# Patient Record
Sex: Male | Born: 1990
Health system: Southern US, Community
[De-identification: ages and names within clinical notes are randomized; demographics above are authoritative.]

## PROBLEM LIST (undated history)

## (undated) DIAGNOSIS — J302 Other seasonal allergic rhinitis: Secondary | ICD-10-CM

## (undated) DIAGNOSIS — H33321 Round hole, right eye: Secondary | ICD-10-CM

## (undated) HISTORY — DX: Other seasonal allergic rhinitis: J30.2

## (undated) HISTORY — DX: Round hole, right eye: H33.321

---
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
## 2009-06-15 ENCOUNTER — Emergency Department: Payer: Self-pay | Admitting: Unknown Physician Specialty

## 2009-12-06 ENCOUNTER — Emergency Department: Payer: Self-pay | Admitting: Emergency Medicine

## 2018-09-07 ENCOUNTER — Emergency Department
Admission: EM | Admit: 2018-09-07 | Discharge: 2018-09-07 | Disposition: A | Discharge status: Home / Self Care | Payer: Self-pay | Attending: Emergency Medicine | Admitting: Emergency Medicine

## 2018-09-07 ENCOUNTER — Emergency Department: Payer: Self-pay

## 2018-09-07 ENCOUNTER — Other Ambulatory Visit: Payer: Self-pay

## 2018-09-07 ENCOUNTER — Encounter: Payer: Self-pay | Admitting: *Deleted

## 2018-09-07 DIAGNOSIS — R002 Palpitations: Secondary | ICD-10-CM | POA: Insufficient documentation

## 2018-09-07 LAB — BASIC METABOLIC PANEL
ANION GAP: 7 (ref 5–15)
BUN: 15 mg/dL (ref 6–20)
CHLORIDE: 103 mmol/L (ref 98–111)
CO2: 27 mmol/L (ref 22–32)
Calcium: 9 mg/dL (ref 8.9–10.3)
Creatinine, Ser: 1.15 mg/dL (ref 0.61–1.24)
GFR calc non Af Amer: >60 mL/min (ref >60)
GLUCOSE: 99 mg/dL (ref 70–99)
Potassium: 3.8 mmol/L (ref 3.5–5.1)
Sodium: 137 mmol/L (ref 135–145)

## 2018-09-07 LAB — CBC
HCT: 43.8 % (ref 40.0–52.0)
HEMOGLOBIN: 14.5 g/dL (ref 13.0–18.0)
MCH: 25.9 pg — AB (ref 26.0–34.0)
MCHC: 33.1 g/dL (ref 32.0–36.0)
MCV: 78.1 fL — AB (ref 80.0–100.0)
Platelets: 309 10*3/uL (ref 150–440)
RBC: 5.61 MIL/uL (ref 4.40–5.90)
RDW: 13.8 % (ref 11.5–14.5)
WBC: 7.5 10*3/uL (ref 3.8–10.6)

## 2018-09-07 LAB — TROPONIN I: Troponin I: <0.03 ng/mL (ref <0.03)

## 2018-09-07 MED ORDER — FAMOTIDINE 40 MG PO TABS: 40.0000 mg | ORAL_TABLET | Freq: Every evening | ORAL | 1 refills | Status: DC

## 2018-09-07 NOTE — ED Notes (Signed)
Patient reports intermittent chest pain with occasional radiation to left arm x1 month. Patient also reports intermittent dizziness, SOB, and lightheadedness with chest pain. Patient denies any cardiac history.

## 2018-09-07 NOTE — ED Triage Notes (Signed)
Pt ambulatory to triage.  Pt reports when going to bed at night his heart is racing.  Pt also reports palpitations with chest pain.  No sob.  Pt alert.

## 2018-09-07 NOTE — Discharge Instructions (Addendum)
In follow up please discuss a Holter monitor to keep track of your heart rhythm. Please seek medical attention for any high fevers, chest pain, shortness of breath, change in behavior, persistent vomiting, bloody stool or any other new or concerning symptoms.

## 2018-09-07 NOTE — ED Provider Notes (Signed)
[facility] Emergency Department Provider Note  ____________________________________________   I have reviewed the triage vital signs and the nursing notes.   HISTORY  Chief Complaint Palpitations  History limited by: Not Limited   HPI Samuel Hendricks is a 27 y.o. male who presents to the emergency department today with concerns for palpitations and feeling of abnormal rhythm to his heart.  Patient states that the symptoms have been going on and off for about 1 month.  He has noticed that they are somewhat worse at night.  Will make him jump up out of bed.  He does however also occur during the day.  He has noticed no pattern with it during the day.  Today when he had 1 of these episodes he also started feeling lightheaded.  Patient denies any new medications recently.  No recent trauma to his chest.  Does have one uncle who died from heart related illness in his 50s.   No past medical history on file.  There are no active problems to display for this patient.  Prior to Admission medications   Not on File    Allergies Patient has no known allergies.  No family history on file.  Social History Social History   Tobacco Use  . Smoking status: Never Smoker  . Smokeless tobacco: Never Used  Substance Use Topics  . Alcohol use: Never    Frequency: Never  . Drug use: Never    Review of Systems Constitutional: No fever/chills Eyes: No visual changes. ENT: No sore throat. Cardiovascular: Denies chest pain. Respiratory: Denies shortness of breath. Gastrointestinal: No abdominal pain.  No nausea, no vomiting.  No diarrhea.   Genitourinary: Negative for dysuria. Musculoskeletal: Negative for back pain. Skin: Negative for rash. Neurological: Negative for headaches, focal weakness or numbness.  ____________________________________________   PHYSICAL EXAM:  VITAL SIGNS: ED Triage Vitals  Enc Vitals Group     BP 09/07/18 1741 127/87     Pulse  Rate 09/07/18 1741 76     Resp 09/07/18 1741 20     Temp 09/07/18 1741 98.8 F (37.1 C)     Temp Source 09/07/18 1741 Oral     SpO2 09/07/18 1741 99 %     Weight 09/07/18 1742 285 lb (129.3 kg)     Height 09/07/18 1742 6\' 2"  (1.88 m)     Head Circumference --      Peak Flow --      Pain Score 09/07/18 1742 0   Constitutional: Alert and oriented.  Eyes: Conjunctivae are normal.  ENT      Head: Normocephalic and atraumatic.      Nose: No congestion/rhinnorhea.      Mouth/Throat: Mucous membranes are moist.      Neck: No stridor. Hematological/Lymphatic/Immunilogical: No cervical lymphadenopathy. Cardiovascular: Normal rate, regular rhythm.  No murmurs, rubs, or gallops.  Respiratory: Normal respiratory effort without tachypnea nor retractions. Breath sounds are clear and equal bilaterally. No wheezes/rales/rhonchi. Gastrointestinal: Soft and non tender. No rebound. No guarding.  Genitourinary: Deferred Musculoskeletal: Normal range of motion in all extremities. No lower extremity edema. Neurologic:  Normal speech and language. No gross focal neurologic deficits are appreciated.  Skin:  Skin is warm, dry and intact. No rash noted. Psychiatric: Mood and affect are normal. Speech and behavior are normal. Patient exhibits appropriate insight and judgment.  ____________________________________________    LABS (pertinent positives/negatives)  Trop <0.03 BMP wnl CBC wbc 7.5, hgb 14.5, plt 309  ____________________________________________   EKG  Apolonio Schneiders, attending physician, personally viewed and interpreted this EKG  EKG Time: 1736 Rate: 74 Rhythm: normal sinus rhythm Axis: normal Intervals: qtc 412 QRS: narrow ST changes: no st elevation Impression: normal ekg   ____________________________________________    RADIOLOGY  CXR No acute  disease  ____________________________________________   PROCEDURES  Procedures  ____________________________________________   INITIAL IMPRESSION / ASSESSMENT AND PLAN / ED COURSE  Pertinent labs & imaging results that were available during my care of the patient were reviewed by me and considered in my medical decision making (see chart for details).   Patient presented to the emergency department today because of concerns for palpitations.  Patient's work-up here without concerning findings.  EKG without dysrhythmia, no delta waves no Brugada.  Chest x-ray without an enlarged heart.  Blood work without concerning electrolyte abnormalities or evidence of heart damage.  Discussed with patient results.  Did discuss importance of follow-up and possible Holter monitor to evaluate.   ____________________________________________   FINAL CLINICAL IMPRESSION(S) / ED DIAGNOSES  Final diagnoses:  Palpitations     Note: This dictation was prepared with Dragon dictation. Any transcriptional errors that result from this process are unintentional     Nance Pear, MD 09/07/18 1905

## 2018-09-07 NOTE — ED Notes (Signed)
Pt discharged home after verbalizing understanding of discharge instructions; nad noted. 

## 2018-09-08 ENCOUNTER — Telehealth: Payer: Self-pay

## 2018-09-08 NOTE — Telephone Encounter (Signed)
Lmov for patient to call and schedule ED fu from 09/07/18 for CP   Will try again at later time

## 2018-09-10 NOTE — Telephone Encounter (Signed)
Patient scheduled for 11/12/18

## 2018-09-25 ENCOUNTER — Ambulatory Visit: Payer: Self-pay | Admitting: Family Medicine

## 2018-09-25 ENCOUNTER — Encounter: Payer: Self-pay | Admitting: Family Medicine

## 2018-09-25 VITALS — BP 132/86 | HR 68 | Temp 97.8°F | Resp 18 | Ht 73.0 in | Wt 278.3 lb

## 2018-09-25 DIAGNOSIS — E049 Nontoxic goiter, unspecified: Secondary | ICD-10-CM

## 2018-09-25 DIAGNOSIS — Z113 Encounter for screening for infections with a predominantly sexual mode of transmission: Secondary | ICD-10-CM

## 2018-09-25 DIAGNOSIS — Z13220 Encounter for screening for lipoid disorders: Secondary | ICD-10-CM

## 2018-09-25 DIAGNOSIS — E669 Obesity, unspecified: Secondary | ICD-10-CM

## 2018-09-25 DIAGNOSIS — R002 Palpitations: Secondary | ICD-10-CM

## 2018-09-25 DIAGNOSIS — R5383 Other fatigue: Secondary | ICD-10-CM

## 2018-09-25 DIAGNOSIS — Z131 Encounter for screening for diabetes mellitus: Secondary | ICD-10-CM

## 2018-09-25 DIAGNOSIS — Z832 Family history of diseases of the blood and blood-forming organs and certain disorders involving the immune mechanism: Secondary | ICD-10-CM

## 2018-09-25 DIAGNOSIS — R030 Elevated blood-pressure reading, without diagnosis of hypertension: Secondary | ICD-10-CM

## 2018-09-25 DIAGNOSIS — R718 Other abnormality of red blood cells: Secondary | ICD-10-CM

## 2018-09-25 DIAGNOSIS — Z23 Encounter for immunization: Secondary | ICD-10-CM

## 2018-09-25 MED ORDER — ATENOLOL 25 MG PO TABS: 12.5000 mg | ORAL_TABLET | Freq: Every evening | ORAL | 0 refills | Status: DC

## 2018-09-25 NOTE — Addendum Note (Signed)
Addended by: Inda Coke on: 09/25/2018 09:53 AM   Modules accepted: Orders

## 2018-09-25 NOTE — Progress Notes (Signed)
Name: Samuel Hendricks   MRN: [id number]    DOB: [birth year]   Date:09/25/2018       Progress Note  Subjective  Chief Complaint  Chief Complaint  Patient presents with  . Establish Care    HPI  Elevated BP: he is worried because there is family history of HTN and bp at home has been in the 140's/90's after work, but in am's usually in the 120's/80's. He denies chest pain , he has intermittent palpitation. He went to [facility] a couple of weeks ago with symptoms of palpitation and dizziness. Hi basic metabolic panel that was normal. CBC was normal except for low MCV, and negative troponin. His weight was 285 lbs. He states he has lost weight over the past few months. He was in the 298 lbs in the beginning of 2019. He states he has been trying to lose weight by increasing physical activity. He states he has noticed fatigue.  His sister and father have thyroid problems. He has been more anxious over the past few months. He states he went through 22 interviews in the past 3 months, he states that is when symptoms really started. Explained it may be all related to anxiety   Past Surgical History:  Procedure Laterality Date  . APPENDECTOMY  12/02/1997    Family History  Problem Relation Age of Onset  . Anemia Mother   . Diabetes Father   . Thyroid disease Sister   . Hypertension Maternal Grandmother   . Heart disease Paternal Grandmother   . Diabetes Paternal Grandfather     Social History   Socioeconomic History  . Marital status: Single    Spouse name: Not on file  . Number of children: 0  . Years of education: Not on file  . Highest education level: Bachelor's degree (e.g., BA, AB, BS)  Occupational History  . Occupation: Admission Counselor    Comment: [place]  . Financial resource strain: Not hard at all  . Food insecurity:    Worry: Never true    Inability: Never true  . Transportation needs:    Medical: No    Non-medical: No  Tobacco Use  . Smoking  status: Never Smoker  . Smokeless tobacco: Never Used  Substance and Sexual Activity  . Alcohol use: Not Currently    Frequency: Never    Comment: rarely  . Drug use: Not Currently    Types: Marijuana    Comment: in the past  . Sexual activity: Yes    Partners: Female    Birth control/protection: Condom  Lifestyle  . Physical activity:    Days per week: 1 day    Minutes per session: 40 min  . Stress: Only a little  Relationships  . Social connections:    Talks on phone: More than three times a week    Gets together: Three times a week    Attends religious service: More than 4 times per year    Active member of club or organization: Yes    Attends meetings of clubs or organizations: More than 4 times per year    Relationship status: Never married  . Intimate partner violence:    Fear of current or ex partner: No    Emotionally abused: No    Physically abused: No    Forced sexual activity: No  Other Topics Concern  . Not on file  Social History Narrative   Graduated from [employer] in [employer]  Currently working at [employer]      Current Outpatient Medications:  .  atenolol (TENORMIN) 25 MG tablet, Take 0.5-1 tablets (12.5-25 mg total) by mouth every evening., Disp: 30 tablet, Rfl: 0 .  famotidine (PEPCID) 40 MG tablet, Take 1 tablet (40 mg total) by mouth every evening. (Patient not taking: Reported on 09/25/2018), Disp: 30 tablet, Rfl: 1  No Known Allergies  I personally reviewed active problem list, medication list, allergies, family history, social history, health maintenance with the patient/caregiver today.   ROS  Constitutional: Negative for fever, positive for weight change.  Respiratory: Negative for cough , positive for intermittent shortness of breath.   Cardiovascular: Negative for chest pain , but he has intermittent  palpitations.  Gastrointestinal: Negative for abdominal pain, no bowel changes.  Musculoskeletal: Negative for gait problem  or joint swelling.  Skin: Negative for rash.  Neurological: Negative for dizziness or headache.  No other specific complaints in a complete review of systems (except as listed in HPI above).  Objective  Vitals:   09/25/18 0828  BP: 132/86  Pulse: 68  Resp: 18  Temp: 97.8 F (36.6 C)  TempSrc: Oral  SpO2: 99%  Weight: 278 lb 4.8 oz (126.2 kg)  Height: _0  (1.854 m)    Body mass index is 36.72 kg/m.  Physical Exam  Constitutional: Patient appears well-developed and well-nourished. Obese  No distress.  HEENT: head atraumatic, normocephalic, pupils equal and reactive to light, neck supple, throat within normal limits Cardiovascular: Normal rate, regular rhythm and normal heart sounds.  No murmur heard. No BLE edema. Pulmonary/Chest: Effort normal and breath sounds normal. No respiratory distress. Abdominal: Soft.  There is no tenderness. Psychiatric: Patient has a normal mood and affect. behavior is normal. Judgment and thought content normal.  Recent Results (from the past 2160 hour(s))  Basic metabolic panel     Status: None   Collection Time: 09/07/18  5:44 PM  Result Value Ref Range   Sodium 137 135 - 145 mmol/L   Potassium 3.8 3.5 - 5.1 mmol/L   Chloride 103 98 - 111 mmol/L   CO2 27 22 - 32 mmol/L   Glucose, Bld 99 70 - 99 mg/dL   BUN 15 6 - 20 mg/dL   Creatinine, Ser 1.15 0.61 - 1.24 mg/dL   Calcium 9.0 8.9 - 10.3 mg/dL   GFR calc non Af Amer >60 >60 mL/min   GFR calc Af Amer >60 >60 mL/min    Comment: (NOTE) The eGFR has been calculated using the CKD EPI equation. This calculation has not been validated in all clinical situations. eGFR's persistently <60 mL/min signify possible Chronic Kidney Disease.    Anion gap 7 5 - 15    Comment: Performed at [facility], [address] [place], [place] [address]  CBC     Status: Abnormal   Collection Time: 09/07/18  5:44 PM  Result Value Ref Range   WBC 7.5 3.8 - 10.6 K/uL   RBC 5.61 4.40 - 5.90 MIL/uL    Hemoglobin 14.5 13.0 - 18.0 g/dL   HCT 43.8 40.0 - 52.0 %   MCV 78.1 (L) 80.0 - 100.0 fL   MCH 25.9 (L) 26.0 - 34.0 pg   MCHC 33.1 32.0 - 36.0 g/dL   RDW 13.8 11.5 - 14.5 %   Platelets 309 150 - 440 K/uL    Comment: Performed at [facility], [address], [place], [place] [address]  Troponin I     Status:  None   Collection Time: 09/07/18  5:44 PM  Result Value Ref Range   Troponin I <0.03 <0.03 ng/mL    Comment: Performed at [facility], [address] [place], [place] [address]     PHQ2/9: Depression screen [facility] 2/9 09/25/2018  Decreased Interest 1  Down, Depressed, Hopeless 0  PHQ - 2 Score 1  Altered sleeping 1  Tired, decreased energy 2  Change in appetite 1  Feeling bad or failure about yourself  0  Trouble concentrating 0  Moving slowly or fidgety/restless 0  Suicidal thoughts 0  PHQ-9 Score 5  Difficult doing work/chores Somewhat difficult     Fall Risk: Fall Risk  09/25/2018  Falls in the past year? No     Functional Status Survey: Is the patient deaf or have difficulty hearing?: No Does the patient have difficulty seeing, even when wearing glasses/contacts?: Yes(glasses) Does the patient have difficulty concentrating, remembering, or making decisions?: No Does the patient have difficulty walking or climbing stairs?: No Does the patient have difficulty dressing or bathing?: No Does the patient have difficulty doing errands alone such as visiting a doctor's office or shopping?: No    Assessment & Plan  1. Other fatigue  - Hepatic function panel - B12 and Folate Panel  2. Need for immunization against influenza  - Flu Vaccine QUAD 6+ mos PF IM (Fluarix Quad PF)  3. Routine screening for STI (sexually transmitted infection)  - RPR - HIV antibody (with reflex) - Hepatitis, Acute - GC Probe amplification, urine  4. Elevated blood pressure reading  He will continue monitoring at home  5. Palpitation  - Thyroid Panel  With TSH - atenolol (TENORMIN) 25 MG tablet; Take 0.5-1 tablets (12.5-25 mg total) by mouth every evening.  Dispense: 30 tablet; Refill: 0  6. Obesity (BMI 35.0-39.9 without comorbidity)  Discussed with the patient the risk posed by an increased BMI. Discussed importance of portion control, calorie counting and at least 150 minutes of physical activity weekly. Avoid sweet beverages and drink more water. Eat at least 6 servings of fruit and vegetables daily   7. Family history of sickle cell trait  - Sickle Cell Scr  8. Abnormal red blood cells  - Iron, TIBC and Ferritin Panel; Future  9. Diabetes mellitus screening  - HgB A1c  10. Lipid screening  - Lipid panel  11. Enlarged thyroid  - US THYROID; Future

## 2018-09-25 NOTE — Patient Instructions (Signed)
DASH Eating Plan DASH stands for "Dietary Approaches to Stop Hypertension." The DASH eating plan is a healthy eating plan that has been shown to reduce high blood pressure (hypertension). It may also reduce your risk for type 2 diabetes, heart disease, and stroke. The DASH eating plan may also help with weight loss. What are tips for following this plan? General guidelines  Avoid eating more than 2,300 mg (milligrams) of salt (sodium) a day. If you have hypertension, you may need to reduce your sodium intake to 1,500 mg a day.  Limit alcohol intake to no more than 1 drink a day for nonpregnant women and 2 drinks a day for men. One drink equals 12 oz of beer, 5 oz of wine, or 1 oz of hard liquor.  Work with your health care provider to maintain a healthy body weight or to lose weight. Ask what an ideal weight is for you.  Get at least 30 minutes of exercise that causes your heart to beat faster (aerobic exercise) most days of the week. Activities may include walking, swimming, or biking.  Work with your health care provider or diet and nutrition specialist (dietitian) to adjust your eating plan to your individual calorie needs. Reading food labels  Check food labels for the amount of sodium per serving. Choose foods with less than 5 percent of the Daily Value of sodium. Generally, foods with less than 300 mg of sodium per serving fit into this eating plan.  To find whole grains, look for the word "whole" as the first word in the ingredient list. Shopping  Buy products labeled as "low-sodium" or "no salt added."  Buy fresh foods. Avoid canned foods and premade or frozen meals. Cooking  Avoid adding salt when cooking. Use salt-free seasonings or herbs instead of table salt or sea salt. Check with your health care provider or pharmacist before using salt substitutes.  Do not fry foods. Cook foods using healthy methods such as baking, boiling, grilling, and broiling instead.  Cook with  heart-healthy oils, such as olive, canola, soybean, or sunflower oil. Meal planning   Eat a balanced diet that includes: ? 5 or more servings of fruits and vegetables each day. At each meal, try to fill half of your plate with fruits and vegetables. ? Up to 6-8 servings of whole grains each day. ? Less than 6 oz of lean meat, poultry, or fish each day. A 3-oz serving of meat is about the same size as a deck of cards. One egg equals 1 oz. ? 2 servings of low-fat dairy each day. ? A serving of nuts, seeds, or beans 5 times each week. ? Heart-healthy fats. Healthy fats called Omega-3 fatty acids are found in foods such as flaxseeds and coldwater fish, like sardines, salmon, and mackerel.  Limit how much you eat of the following: ? Canned or prepackaged foods. ? Food that is high in trans fat, such as fried foods. ? Food that is high in saturated fat, such as fatty meat. ? Sweets, desserts, sugary drinks, and other foods with added sugar. ? Full-fat dairy products.  Do not salt foods before eating.  Try to eat at least 2 vegetarian meals each week.  Eat more home-cooked food and less restaurant, buffet, and fast food.  When eating at a restaurant, ask that your food be prepared with less salt or no salt, if possible. What foods are recommended? The items listed may not be a complete list. Talk with your dietitian about what   dietary choices are best for you. Grains Whole-grain or whole-wheat bread. Whole-grain or whole-wheat pasta. Brown rice. Oatmeal. Quinoa. Bulgur. Whole-grain and low-sodium cereals. Pita bread. Low-fat, low-sodium crackers. Whole-wheat flour tortillas. Vegetables Fresh or frozen vegetables (raw, steamed, roasted, or grilled). Low-sodium or reduced-sodium tomato and vegetable juice. Low-sodium or reduced-sodium tomato sauce and tomato paste. Low-sodium or reduced-sodium canned vegetables. Fruits All fresh, dried, or frozen fruit. Canned fruit in natural juice (without  added sugar). Meat and other protein foods Skinless chicken or turkey. Ground chicken or turkey. Pork with fat trimmed off. Fish and seafood. Egg whites. Dried beans, peas, or lentils. Unsalted nuts, nut butters, and seeds. Unsalted canned beans. Lean cuts of beef with fat trimmed off. Low-sodium, lean deli meat. Dairy Low-fat (1%) or fat-free (skim) milk. Fat-free, low-fat, or reduced-fat cheeses. Nonfat, low-sodium ricotta or cottage cheese. Low-fat or nonfat yogurt. Low-fat, low-sodium cheese. Fats and oils Soft margarine without trans fats. Vegetable oil. Low-fat, reduced-fat, or light mayonnaise and salad dressings (reduced-sodium). Canola, safflower, olive, soybean, and sunflower oils. Avocado. Seasoning and other foods Herbs. Spices. Seasoning mixes without salt. Unsalted popcorn and pretzels. Fat-free sweets. What foods are not recommended? The items listed may not be a complete list. Talk with your dietitian about what dietary choices are best for you. Grains Baked goods made with fat, such as croissants, muffins, or some breads. Dry pasta or rice meal packs. Vegetables Creamed or fried vegetables. Vegetables in a cheese sauce. Regular canned vegetables (not low-sodium or reduced-sodium). Regular canned tomato sauce and paste (not low-sodium or reduced-sodium). Regular tomato and vegetable juice (not low-sodium or reduced-sodium). Pickles. Olives. Fruits Canned fruit in a light or heavy syrup. Fried fruit. Fruit in cream or butter sauce. Meat and other protein foods Fatty cuts of meat. Ribs. Fried meat. Bacon. Sausage. Bologna and other processed lunch meats. Salami. Fatback. Hotdogs. Bratwurst. Salted nuts and seeds. Canned beans with added salt. Canned or smoked fish. Whole eggs or egg yolks. Chicken or turkey with skin. Dairy Whole or 2% milk, cream, and half-and-half. Whole or full-fat cream cheese. Whole-fat or sweetened yogurt. Full-fat cheese. Nondairy creamers. Whipped toppings.  Processed cheese and cheese spreads. Fats and oils Butter. Stick margarine. Lard. Shortening. Ghee. Bacon fat. Tropical oils, such as coconut, palm kernel, or palm oil. Seasoning and other foods Salted popcorn and pretzels. Onion salt, garlic salt, seasoned salt, table salt, and sea salt. Worcestershire sauce. Tartar sauce. Barbecue sauce. Teriyaki sauce. Soy sauce, including reduced-sodium. Steak sauce. Canned and packaged gravies. Fish sauce. Oyster sauce. Cocktail sauce. Horseradish that you find on the shelf. Ketchup. Mustard. Meat flavorings and tenderizers. Bouillon cubes. Hot sauce and Tabasco sauce. Premade or packaged marinades. Premade or packaged taco seasonings. Relishes. Regular salad dressings. Where to find more information:  National Heart, Lung, and Blood Institute: www.nhlbi.nih.gov  American Heart Association: www.heart.org Summary  The DASH eating plan is a healthy eating plan that has been shown to reduce high blood pressure (hypertension). It may also reduce your risk for type 2 diabetes, heart disease, and stroke.  With the DASH eating plan, you should limit salt (sodium) intake to 2,300 mg a day. If you have hypertension, you may need to reduce your sodium intake to 1,500 mg a day.  When on the DASH eating plan, aim to eat more fresh fruits and vegetables, whole grains, lean proteins, low-fat dairy, and heart-healthy fats.  Work with your health care provider or diet and nutrition specialist (dietitian) to adjust your eating plan to your individual   calorie needs. This information is not intended to replace advice given to you by your health care provider. Make sure you discuss any questions you have with your health care provider. Document Released: 11/07/2011 Document Revised: 11/11/2016 Document Reviewed: 11/11/2016 Elsevier Interactive Patient Education  2018 [employer]  

## 2018-09-26 ENCOUNTER — Encounter: Payer: Self-pay | Admitting: Family Medicine

## 2018-09-29 ENCOUNTER — Ambulatory Visit: Payer: Self-pay | Admitting: Family Medicine

## 2018-09-30 ENCOUNTER — Other Ambulatory Visit (HOSPITAL_COMMUNITY)
Admission: RE | Admit: 2018-09-30 | Discharge: 2018-09-30 | Disposition: A | Discharge status: Home / Self Care | Payer: BLUE CROSS/BLUE SHIELD | Source: Ambulatory Visit | Attending: Family Medicine | Admitting: Family Medicine

## 2018-09-30 ENCOUNTER — Encounter: Payer: Self-pay | Admitting: Family Medicine

## 2018-09-30 ENCOUNTER — Ambulatory Visit (INDEPENDENT_AMBULATORY_CARE_PROVIDER_SITE_OTHER): Payer: Self-pay | Admitting: Family Medicine

## 2018-09-30 VITALS — BP 110/72 | HR 87 | Temp 98.8°F | Resp 16 | Ht 73.0 in | Wt 274.5 lb

## 2018-09-30 DIAGNOSIS — Z113 Encounter for screening for infections with a predominantly sexual mode of transmission: Secondary | ICD-10-CM | POA: Insufficient documentation

## 2018-09-30 DIAGNOSIS — R634 Abnormal weight loss: Secondary | ICD-10-CM

## 2018-09-30 DIAGNOSIS — K625 Hemorrhage of anus and rectum: Secondary | ICD-10-CM

## 2018-09-30 DIAGNOSIS — R590 Localized enlarged lymph nodes: Secondary | ICD-10-CM

## 2018-09-30 MED ORDER — HYDROCORTISONE ACETATE 25 MG RE SUPP: 25.0000 mg | Freq: Two times a day (BID) | RECTAL | 0 refills | Status: DC

## 2018-09-30 MED ORDER — AMOXICILLIN-POT CLAVULANATE 875-125 MG PO TABS: 1.0000 | ORAL_TABLET | Freq: Two times a day (BID) | ORAL | 0 refills | Status: AC

## 2018-09-30 NOTE — Progress Notes (Addendum)
Name: Samuel Hendricks   MRN: [id number]    DOB: [birth year]   Date:09/30/2018       Progress Note  Subjective  Chief Complaint  Chief Complaint  Patient presents with  . Rectal Bleeding    for 5 days  . Cyst    left clavicle for 5 days    HPI  Pt presents with concern for rectal bleeding. Notes over the last year he has has a few smears of bright red blood every few weeks - does have some constipation (worse over the last 6 months) and thought he maybe had hemorrhoids.  Woke up Saturday morning and had a moderate amount of blood during and after having a BM - had consistent bleeding from 7am through about 3pm.  He did try using a suppository (unsure of what kind), used epsom salt sitz baths.  He endorses pain in the past with BM's but not on Saturday.  Bleeding has since stopped as of Sunday morning.  He had BM this morning - no bleeding and no pain. Denies abdominal pain, nausea/vomiting.  Does endorses "fullness" in his abdomen sometimes.  No family history of ulcerative colitis or Crohn's disease. - He has been having ongoing fatigue for which he saw Dr. Ancil Boozer on 09/25/2018.  - Clavicular lymph node swelling - LEFT sided, small node that is moveable, mildly tender, non-erythematous, found on Saturday after waking up with rectal bleeding as above and with nasal congestion (congestion has since resolved).   - Weight loss of 11lbs since 09/07/18 (285 in ER on 09/07/18, today he is 274lbs). - Negative CXR on 09/07/2018  There are no active problems to display for this patient.   Social History   Tobacco Use  . Smoking status: Never Smoker  . Smokeless tobacco: Never Used  Substance Use Topics  . Alcohol use: Not Currently    Frequency: Never    Comment: rarely     Current Outpatient Medications:  .  atenolol (TENORMIN) 25 MG tablet, Take 0.5-1 tablets (12.5-25 mg total) by mouth every evening. (Patient not taking: Reported on 09/30/2018), Disp: 30 tablet, Rfl: 0 .  famotidine  (PEPCID) 40 MG tablet, Take 1 tablet (40 mg total) by mouth every evening. (Patient not taking: Reported on 09/25/2018), Disp: 30 tablet, Rfl: 1  No Known Allergies  I personally reviewed active problem list, medication list, allergies, notes from last encounter with the patient/caregiver today.  ROS  Ten systems reviewed and is negative except as mentioned in HPI.  Objective  Vitals:   09/30/18 0742  BP: 110/72  Pulse: 87  Resp: 16  Temp: 98.8 F (37.1 C)  TempSrc: Oral  SpO2: 99%  Weight: 274 lb 8 oz (124.5 kg)  Height: 6\' 1"  (1.854 m)   Body mass index is 36.22 kg/m.  Nursing Note and Vital Signs reviewed.  Physical Exam  Constitutional: Patient appears well-developed and well-nourished. No distress.  HENT: Head: Normocephalic and atraumatic. Neck: Normal range of motion. Neck supple. No JVD present. No thyromegaly present. There is a moderately sized, moveable, mildly tender lymph node on the LEFT clavicle. Cardiovascular: Normal rate, regular rhythm and normal heart sounds.  No murmur heard. No BLE edema. Pulmonary/Chest: Effort normal and breath sounds normal. No respiratory distress. Abdominal: Soft. Bowel sounds are normal, no distension. There is no tenderness. no masses RECTAL: Prostate normal size and consistency, no rectal masses or external hemorrhoids.  He does endorses tenderness to the rectum with internal examination. Musculoskeletal: Normal range of  motion, no joint effusions. No gross deformities Neurological: he is alert and oriented to person, place, and time. No cranial nerve deficit. Coordination, balance, strength, speech and gait are normal.  Skin: Skin is warm and dry. No rash noted. No erythema.  Psychiatric: Patient has a normal mood and affect. behavior is normal. Judgment and thought content normal.  No results found for this or any previous visit (from the past 72 hour(s)).  Assessment & Plan  1. Bright red rectal bleeding - Ambulatory  referral to Gastroenterology - CBC w/Diff/Platelet - hydrocortisone (ANUSOL-HC) 25 MG suppository; Place 1 suppository (25 mg total) rectally 2 (two) times daily.  Dispense: 12 suppository; Refill: 0  2. Lymphadenopathy of left cervical region - CBC w/Diff/Platelet; will complete other labs ordered by PCP Dr. Ancil Boozer on 09/25/2018 today as well. - amoxicillin-clavulanate (AUGMENTIN) 875-125 MG tablet; Take 1 tablet by mouth 2 (two) times daily for 10 days.  Dispense: 20 tablet; Refill: 0  - Discussed possible causes of lymphadenopathy - if not improved after augmentin, he will call back and we will refer to surgery for biopsy.  He has several labs ordered from previous encounter and today - his insurance kicks in on Friday - I recommend he have CBC w/ diff and HIV performed today.  -Red flags and when to present for emergency care or RTC including fever >101.46F, chest pain, shortness of breath, new/worsening/un-resolving symptoms, severe rectal bleeding with lightheadedness/dizziness or hemoptemesis reviewed with patient at time of visit. Follow up and care instructions discussed and provided in AVS.

## 2018-09-30 NOTE — Addendum Note (Signed)
Addended by: Hubbard Hartshorn on: 09/30/2018 08:37 AM   Modules accepted: Orders

## 2018-09-30 NOTE — Addendum Note (Signed)
Addended by: Raelyn Ensign E on: 09/30/2018 01:04 PM   Modules accepted: Orders

## 2018-10-01 ENCOUNTER — Encounter: Payer: Self-pay | Admitting: Family Medicine

## 2018-10-01 LAB — CBC WITH DIFFERENTIAL/PLATELET
BASOS ABS: 39 {cells}/uL (ref 0–200)
BASOS PCT: 0.9 %
EOS ABS: 224 {cells}/uL (ref 15–500)
Eosinophils Relative: 5.2 %
HCT: 44.3 % (ref 38.5–50.0)
HEMOGLOBIN: 14.9 g/dL (ref 13.2–17.1)
Lymphs Abs: 1570 cells/uL (ref 850–3900)
MCH: 26.1 pg — AB (ref 27.0–33.0)
MCHC: 33.6 g/dL (ref 32.0–36.0)
MCV: 77.7 fL — AB (ref 80.0–100.0)
MPV: 10.2 fL (ref 7.5–12.5)
Monocytes Relative: 8.4 %
Neutro Abs: 2107 cells/uL (ref 1500–7800)
Neutrophils Relative %: 49 %
Platelets: 334 10*3/uL (ref 140–400)
RBC: 5.7 10*6/uL (ref 4.20–5.80)
RDW: 13.1 % (ref 11.0–15.0)
Total Lymphocyte: 36.5 %
WBC: 4.3 10*3/uL (ref 3.8–10.8)
WBCMIX: 361 {cells}/uL (ref 200–950)

## 2018-10-01 LAB — HIV ANTIBODY (ROUTINE TESTING W REFLEX): HIV 1&2 Ab, 4th Generation: NONREACTIVE

## 2018-10-01 LAB — CYTOLOGY, (ORAL, ANAL, URETHRAL) ANCILLARY ONLY
CHLAMYDIA, DNA PROBE: NEGATIVE
NEISSERIA GONORRHEA: NEGATIVE

## 2018-10-01 LAB — RPR: RPR Ser Ql: NONREACTIVE

## 2018-10-02 DIAGNOSIS — R002 Palpitations: Secondary | ICD-10-CM | POA: Diagnosis not present

## 2018-10-02 DIAGNOSIS — R634 Abnormal weight loss: Secondary | ICD-10-CM | POA: Diagnosis not present

## 2018-10-02 DIAGNOSIS — R718 Other abnormality of red blood cells: Secondary | ICD-10-CM | POA: Diagnosis not present

## 2018-10-02 DIAGNOSIS — R5383 Other fatigue: Secondary | ICD-10-CM | POA: Diagnosis not present

## 2018-10-02 DIAGNOSIS — Z13220 Encounter for screening for lipoid disorders: Secondary | ICD-10-CM | POA: Diagnosis not present

## 2018-10-03 LAB — COMPLETE METABOLIC PANEL WITH GFR
AG Ratio: 1.6 (calc) (ref 1.0–2.5)
ALBUMIN MSPROF: 4.4 g/dL (ref 3.6–5.1)
ALT: 15 U/L (ref 9–46)
AST: 15 U/L (ref 10–40)
Alkaline phosphatase (APISO): 61 U/L (ref 40–115)
BILIRUBIN TOTAL: 0.5 mg/dL (ref 0.2–1.2)
BUN: 12 mg/dL (ref 7–25)
CO2: 28 mmol/L (ref 20–32)
CREATININE: 1.09 mg/dL (ref 0.60–1.35)
Calcium: 9.4 mg/dL (ref 8.6–10.3)
Chloride: 102 mmol/L (ref 98–110)
GFR, EST AFRICAN AMERICAN: 106 mL/min/{1.73_m2} (ref >=60)
GFR, Est Non African American: 92 mL/min/{1.73_m2} (ref >=60)
GLOBULIN: 2.7 g/dL (ref 1.9–3.7)
Glucose, Bld: 94 mg/dL (ref 65–99)
Potassium: 4.2 mmol/L (ref 3.5–5.3)
SODIUM: 137 mmol/L (ref 135–146)
TOTAL PROTEIN: 7.1 g/dL (ref 6.1–8.1)

## 2018-10-03 LAB — B12 AND FOLATE PANEL
FOLATE: 13.6 ng/mL
Vitamin B-12: 411 pg/mL (ref 200–1100)

## 2018-10-03 LAB — LIPID PANEL
CHOLESTEROL: 140 mg/dL (ref <200)
HDL: 35 mg/dL — ABNORMAL LOW (ref >40)
LDL CHOLESTEROL (CALC): 92 mg/dL
Non-HDL Cholesterol (Calc): 105 mg/dL (calc) (ref <130)
TRIGLYCERIDES: 50 mg/dL (ref <150)
Total CHOL/HDL Ratio: 4 (calc) (ref <5.0)

## 2018-10-03 LAB — IRON,TIBC AND FERRITIN PANEL
%SAT: 33 % (calc) (ref 20–48)
Ferritin: 142 ng/mL (ref 38–380)
IRON: 93 ug/dL (ref 50–195)
TIBC: 285 mcg/dL (calc) (ref 250–425)

## 2018-10-03 LAB — HEPATIC FUNCTION PANEL
AG RATIO: 1.6 (calc) (ref 1.0–2.5)
ALKALINE PHOSPHATASE (APISO): 61 U/L (ref 40–115)
ALT: 15 U/L (ref 9–46)
AST: 15 U/L (ref 10–40)
Albumin: 4.4 g/dL (ref 3.6–5.1)
BILIRUBIN DIRECT: 0.1 mg/dL (ref 0.0–0.2)
BILIRUBIN INDIRECT: 0.4 mg/dL (ref 0.2–1.2)
GLOBULIN: 2.7 g/dL (ref 1.9–3.7)
Total Bilirubin: 0.5 mg/dL (ref 0.2–1.2)
Total Protein: 7.1 g/dL (ref 6.1–8.1)

## 2018-10-03 LAB — THYROID PANEL WITH TSH
FREE THYROXINE INDEX: 2.5 (ref 1.4–3.8)
T3 Uptake: 32 % (ref 22–35)
T4, Total: 7.9 ug/dL (ref 4.9–10.5)
TSH: 1.91 m[IU]/L (ref 0.40–4.50)

## 2018-10-03 LAB — SEDIMENTATION RATE: SED RATE: 2 mm/h (ref 0–15)

## 2018-10-03 LAB — SICKLE CELL SCREEN: Sickle Solubility Test - HGBRFX: NEGATIVE

## 2018-10-03 LAB — HEMOGLOBIN A1C
Hgb A1c MFr Bld: 5 % of total Hgb (ref <5.7)
Mean Plasma Glucose: 97 (calc)
eAG (mmol/L): 5.4 (calc)

## 2018-10-03 LAB — C-REACTIVE PROTEIN: CRP: 6.8 mg/L (ref <8.0)

## 2018-10-05 ENCOUNTER — Encounter: Payer: Self-pay | Admitting: Family Medicine

## 2018-10-05 ENCOUNTER — Ambulatory Visit: Payer: Self-pay | Admitting: Family Medicine

## 2018-10-05 DIAGNOSIS — R590 Localized enlarged lymph nodes: Secondary | ICD-10-CM

## 2018-10-06 ENCOUNTER — Encounter: Payer: Self-pay | Admitting: Family Medicine

## 2018-10-06 ENCOUNTER — Ambulatory Visit
Admission: RE | Admit: 2018-10-06 | Discharge: 2018-10-06 | Disposition: A | Discharge status: Home / Self Care | Payer: BLUE CROSS/BLUE SHIELD | Source: Ambulatory Visit | Attending: Family Medicine | Admitting: Family Medicine

## 2018-10-06 DIAGNOSIS — E049 Nontoxic goiter, unspecified: Secondary | ICD-10-CM | POA: Insufficient documentation

## 2018-10-06 DIAGNOSIS — E040 Nontoxic diffuse goiter: Secondary | ICD-10-CM | POA: Insufficient documentation

## 2018-10-06 DIAGNOSIS — E010 Iodine-deficiency related diffuse (endemic) goiter: Secondary | ICD-10-CM | POA: Diagnosis not present

## 2018-10-06 NOTE — Addendum Note (Signed)
Addended by: Raelyn Ensign E on: 10/06/2018 12:59 PM   Modules accepted: Orders

## 2018-10-07 ENCOUNTER — Other Ambulatory Visit: Payer: Self-pay

## 2018-10-07 ENCOUNTER — Ambulatory Visit: Payer: Self-pay

## 2018-10-07 ENCOUNTER — Encounter: Payer: Self-pay | Admitting: General Surgery

## 2018-10-07 ENCOUNTER — Ambulatory Visit (INDEPENDENT_AMBULATORY_CARE_PROVIDER_SITE_OTHER): Payer: BLUE CROSS/BLUE SHIELD | Admitting: General Surgery

## 2018-10-07 VITALS — BP 134/85 | HR 86 | Temp 97.5°F | Resp 16 | Ht 73.0 in | Wt 280.0 lb

## 2018-10-07 DIAGNOSIS — R221 Localized swelling, mass and lump, neck: Secondary | ICD-10-CM

## 2018-10-07 DIAGNOSIS — R590 Localized enlarged lymph nodes: Secondary | ICD-10-CM | POA: Diagnosis not present

## 2018-10-07 NOTE — Patient Instructions (Signed)
The patient is aware to call back for any questions or concerns.  

## 2018-10-07 NOTE — Progress Notes (Signed)
Patient ID: Samuel Hendricks, male   DOB: 1991/07/13, 27 y.o.   MRN: [id number]  Chief Complaint  Patient presents with  . Adenopathy    left supraclavicular    HPI Samuel Hendricks is a 27 y.o. male.  He was referred by his PCP, Samuel Ensign, FNP, for further evaluation of a left supraclavicular lymph node.  He states that on Saturday, 10/27, he was feeling "sinus-y" and noticed the enlargement of the node in question, "it came up overnight." He saw his doctor on 10/30 and was started on Augmentin, which he is still taking at this time.  He thinks that perhaps the node feels a bit smaller today that it did last week.  He describes it as rubbery, moveable, and not painful.  Since being on the antibiotics, he feels like he is a little less fatigued.  He denies night sweats, significant weight loss, or inappetence.     Past Medical History:  Diagnosis Date  . Retina hole, right    Congential  . Seasonal allergies     Past Surgical History:  Procedure Laterality Date  . APPENDECTOMY  12/02/1997    Family History  Problem Relation Age of Onset  . Anemia Mother   . Diabetes Father   . Thyroid disease Sister   . Hypertension Maternal Grandmother   . Heart disease Paternal Grandmother   . Diabetes Paternal Grandfather   . Colon cancer Neg Hx     Social History Social History   Tobacco Use  . Smoking status: Never Smoker  . Smokeless tobacco: Never Used  Substance Use Topics  . Alcohol use: Yes    Frequency: Never    Comment: rarely  . Drug use: Not Currently    Types: Marijuana    Comment: in the past    No Known Allergies  Current Outpatient Medications  Medication Sig Dispense Refill  . amoxicillin-clavulanate (AUGMENTIN) 875-125 MG tablet Take 1 tablet by mouth 2 (two) times daily for 10 days. 20 tablet 0  . famotidine (PEPCID) 40 MG tablet Take 1 tablet (40 mg total) by mouth every evening. (Patient not taking: Reported on 10/07/2018) 30 tablet 1  . hydrocortisone  (ANUSOL-HC) 25 MG suppository Place 1 suppository (25 mg total) rectally 2 (two) times daily. (Patient not taking: Reported on 10/07/2018) 12 suppository 0   No current facility-administered medications for this visit.     Review of Systems Review of Systems  Constitutional: Negative.   HENT: Positive for congestion.        Improved with antibiotics.  Eyes: Negative.   Respiratory: Negative.   Cardiovascular: Negative.   Gastrointestinal: Positive for blood in stool.       At the time of initial node discovery; now resolved after OTC suppository use.  Endocrine: Negative.   Musculoskeletal: Negative.   Skin: Negative.   Neurological: Negative.   Hematological: Negative.   Psychiatric/Behavioral: Negative.     Blood pressure 134/85, pulse 86, temperature (!) 97.5 F (36.4 C), temperature source Skin, resp. rate 16, height 6\' 1"  (1.854 m), weight 280 lb (127 kg), SpO2 98 %.  Physical Exam Physical Exam  Constitutional: He is oriented to person, place, and time. He appears well-developed and well-nourished. No distress.  HENT:  Head: Normocephalic and atraumatic.  Mouth/Throat: Oropharynx is clear and moist. No oropharyngeal exudate.  Eyes: Pupils are equal, round, and reactive to light. No scleral icterus.  Neck: Normal range of motion. Neck supple. Thyromegaly present.  Mild.  No palpable  nodules. Gland moves freely with deglutition.  Shotty LAD in the bilateral jugular chains.  There is a ~0.75 cm ovoid, mobile, soft and slightly rubbery mass in the left supraclavicular fossa.  Non-tender to palpation.  Slides over the clavicle and feels intradermal.  Cardiovascular: Normal rate and regular rhythm.  Pulmonary/Chest: Effort normal and breath sounds normal.  Abdominal: Soft. Bowel sounds are normal.  Musculoskeletal: He exhibits no edema, tenderness or deformity.  Lymphadenopathy:    He has cervical adenopathy.  Neurological: He is alert and oriented to person, place, and  time.  Skin: Skin is warm and dry. He is not diaphoretic.  Psychiatric: He has a normal mood and affect. His behavior is normal. Judgment and thought content normal.    Data Reviewed CLINICAL DATA:  Palpable abnormality. 27 year old male with thyromegaly on physical exam. Family history of thyroid disease.  EXAM: THYROID ULTRASOUND  TECHNIQUE: Ultrasound examination of the thyroid gland and adjacent soft tissues was performed.  COMPARISON:  None.  FINDINGS: Parenchymal Echotexture: Mildly heterogenous  Isthmus: 0.5 cm  Right lobe: 5.2 x 2 x 2 cm  Left lobe: 4.8 x 2 x 1.8 cm  _________________________________________________________  Estimated total number of nodules >/= 1 cm: 0  Number of spongiform nodules >/=  2 cm not described below (TR1): 0  Number of mixed cystic and solid nodules >/= 1.5 cm not described below (TR2): 0  _________________________________________________________  No discrete nodules are seen within the thyroid gland.  IMPRESSION: Mildly heterogeneous and enlarged thyroid gland.  No thyroid nodules identified.   Electronically Signed   By: Jacqulynn Cadet M.D.   On: 10/06/2018 16:01  I personally reviewed these images. No dedicated LN imaging captured.  Bedside clinical ultrasound performed today.  See separate documentation.   Assessment    27 y/o M with recent sinus symptoms and a palpable left supraclavicular node.  Based upon lack of B symptoms, association with recent URI-type concerns, physical exam and at least partial response to oral abx, I suspect it is a reactive node.    Plan    Ultrasound performed in clinic today.  Consistent with a normal intradermal node without concerning features. No specific follow up in our clinic, but may return PRN or if there are worrisome changes, such as onset of B symptoms, node becomes fixed or woody in character, etc.       Samuel Hendricks 10/07/2018, 4:21 PM

## 2018-10-16 ENCOUNTER — Ambulatory Visit: Payer: BLUE CROSS/BLUE SHIELD | Admitting: Family Medicine

## 2018-10-16 ENCOUNTER — Encounter: Payer: Self-pay | Admitting: Family Medicine

## 2018-10-16 VITALS — BP 130/74 | HR 87 | Temp 98.4°F | Resp 16 | Ht 73.0 in | Wt 280.0 lb

## 2018-10-16 DIAGNOSIS — R590 Localized enlarged lymph nodes: Secondary | ICD-10-CM

## 2018-10-16 DIAGNOSIS — M542 Cervicalgia: Secondary | ICD-10-CM

## 2018-10-16 DIAGNOSIS — E669 Obesity, unspecified: Secondary | ICD-10-CM

## 2018-10-16 DIAGNOSIS — E049 Nontoxic goiter, unspecified: Secondary | ICD-10-CM

## 2018-10-16 DIAGNOSIS — R0981 Nasal congestion: Secondary | ICD-10-CM

## 2018-10-16 DIAGNOSIS — K625 Hemorrhage of anus and rectum: Secondary | ICD-10-CM

## 2018-10-16 DIAGNOSIS — K219 Gastro-esophageal reflux disease without esophagitis: Secondary | ICD-10-CM

## 2018-10-16 MED ORDER — AZELASTINE HCL 0.1 % NA SOLN: 1.0000 | Freq: Two times a day (BID) | NASAL | 2 refills | Status: DC

## 2018-10-16 MED ORDER — FAMOTIDINE 40 MG PO TABS: 40.0000 mg | ORAL_TABLET | Freq: Every day | ORAL | 2 refills | Status: DC

## 2018-10-16 NOTE — Progress Notes (Signed)
Name: Samuel Hendricks   MRN: [id number]    DOB: 1991-09-06   Date:10/16/2018       Progress Note  Subjective  Chief Complaint  Chief Complaint  Patient presents with  . Follow-up    Rectal bleeding has resolved.   . Lymphadenopathy    Unchanged  . Generalized Body Aches    Has soreness in his body. Maybe related to his fall or sleeping.    HPI  Rectal bleeding: used rectal suppository, taking colace prn and no longer having rectal bleeding, appetite is normal and no longer feeling all the time, he has follow up with GI next week  Fatigue/lymphadenopathy: seen by surgeon, he was reassurance, labs reviewed and all normal, he is feeling better, fatigue resolved, anxiety is controlled.   Neck pain: going for weeks, aching like, he works looking at computer all day, no radiculitis. Advised stretching  Recent fall : he leaned back on a chair, while at home and fell backwards, caught self with left hand, and still has some soreness but no longer tingling and mild soreness on his back. He states he will monitor for now  Goiter: had [place] and no nodules, he states he has occasional dysphagia, advised to chew food more and monitor for more, normal thyroid panel   Low HDL: he has changed his diet and we will monitor for now, he will try resuming physical activity now that he feels better  Nasal Congestion: he is taking claritin otc, has nasal congestion, no rhinorrhea or itching, we will try astelin, continue claritin,   Patient Active Problem List   Diagnosis Date Noted  . Goiter diffuse 10/06/2018  . Lymphadenopathy of left cervical region 09/30/2018  . Bright red rectal bleeding 09/30/2018    Past Surgical History:  Procedure Laterality Date  . APPENDECTOMY  12/02/1997    Family History  Problem Relation Age of Onset  . Anemia Mother   . Diabetes Father   . Thyroid disease Sister   . Hypertension Maternal Grandmother   . Heart disease Paternal Grandmother   . Diabetes  Paternal Grandfather   . Colon cancer Neg Hx     Social History   Socioeconomic History  . Marital status: Single    Spouse name: Not on file  . Number of children: 0  . Years of education: Not on file  . Highest education level: Bachelor's degree (e.g., BA, AB, BS)  Occupational History  . Occupation: Admission Counselor    Comment: [employer]  . Financial resource strain: Not hard at all  . Food insecurity:    Worry: Never true    Inability: Never true  . Transportation needs:    Medical: No    Non-medical: No  Tobacco Use  . Smoking status: Never Smoker  . Smokeless tobacco: Never Used  Substance and Sexual Activity  . Alcohol use: Yes    Frequency: Never    Comment: rarely  . Drug use: Not Currently    Types: Marijuana    Comment: in the past  . Sexual activity: Yes    Partners: Female    Birth control/protection: Condom  Lifestyle  . Physical activity:    Days per week: 1 day    Minutes per session: 40 min  . Stress: Only a little  Relationships  . Social connections:    Talks on phone: More than three times a week    Gets together: Three times a week    Attends  religious service: More than 4 times per year    Active member of club or organization: Yes    Attends meetings of clubs or organizations: More than 4 times per year    Relationship status: Never married  . Intimate partner violence:    Fear of current or ex partner: No    Emotionally abused: No    Physically abused: No    Forced sexual activity: No  Other Topics Concern  . Not on file  Social History Narrative   Graduated from [employer] in [employer]   Currently working at [employer]      Current Outpatient Medications:  .  famotidine (PEPCID) 40 MG tablet, Take 1 tablet (40 mg total) by mouth every evening., Disp: 30 tablet, Rfl: 1 .  hydrocortisone (ANUSOL-HC) 25 MG suppository, Place 1 suppository (25 mg total) rectally 2 (two) times daily., Disp: 12  suppository, Rfl: 0  No Known Allergies  I personally reviewed active problem list, medication list, allergies, family history, social history with the patient/caregiver today.   ROS  Constitutional: Negative for fever or weight change.  Respiratory: Negative for cough and shortness of breath.   Cardiovascular: Negative for chest pain or palpitations.  Gastrointestinal: Negative for abdominal pain, no bowel changes.  Musculoskeletal: Negative for gait problem or joint swelling.  Skin: Negative for rash.  Neurological: Negative for dizziness or headache.  No other specific complaints in a complete review of systems (except as listed in HPI above).  Objective  Vitals:   10/16/18 0946  BP: 130/74  Pulse: 87  Resp: 16  Temp: 98.4 F (36.9 C)  TempSrc: Oral  SpO2: 99%  Weight: 280 lb (127 kg)  Height: 6' 1"  (1.854 m)    Body mass index is 36.94 kg/m.  Physical Exam  Constitutional: Patient appears well-developed and well-nourished. Obese No distress.  HEENT: head atraumatic, normocephalic, pupils equal and reactive to light, boggy turbinates, neck supple, throat within normal limits, non tender supraclavicular lymphadenopathy , rubbery  Cardiovascular: Normal rate, regular rhythm and normal heart sounds.  No murmur heard. No BLE edema. Pulmonary/Chest: Effort normal and breath sounds normal. No respiratory distress. Abdominal: Soft.  There is no tenderness. Psychiatric: Patient has a normal mood and affect. behavior is normal. Judgment and thought content normal.  Recent Results (from the past 2160 hour(s))  Basic metabolic panel     Status: None   Collection Time: 09/07/18  5:44 PM  Result Value Ref Range   Sodium 137 135 - 145 mmol/L   Potassium 3.8 3.5 - 5.1 mmol/L   Chloride 103 98 - 111 mmol/L   CO2 27 22 - 32 mmol/L   Glucose, Bld 99 70 - 99 mg/dL   BUN 15 6 - 20 mg/dL   Creatinine, Ser 1.15 0.61 - 1.24 mg/dL   Calcium 9.0 8.9 - 10.3 mg/dL   GFR calc non Af  Amer >60 >60 mL/min   GFR calc Af Amer >60 >60 mL/min    Comment: (NOTE) The eGFR has been calculated using the CKD EPI equation. This calculation has not been validated in all clinical situations. eGFR's persistently <60 mL/min signify possible Chronic Kidney Disease.    Anion gap 7 5 - 15    Comment: Performed at [facility], [address] [place], [place] [address]  CBC     Status: Abnormal   Collection Time: 09/07/18  5:44 PM  Result Value Ref Range   WBC 7.5 3.8 - 10.6 K/uL  RBC 5.61 4.40 - 5.90 MIL/uL   Hemoglobin 14.5 13.0 - 18.0 g/dL   HCT 43.8 40.0 - 52.0 %   MCV 78.1 (L) 80.0 - 100.0 fL   MCH 25.9 (L) 26.0 - 34.0 pg   MCHC 33.1 32.0 - 36.0 g/dL   RDW 13.8 11.5 - 14.5 %   Platelets 309 150 - 440 K/uL    Comment: Performed at [facility], [address] [place], [place] [address]  Troponin I     Status: None   Collection Time: 09/07/18  5:44 PM  Result Value Ref Range   Troponin I <0.03 <0.03 ng/mL    Comment: Performed at [facility], [address] [place], [place] [address]  Cytology (oral, anal, urethral) ancillary only     Status: None   Collection Time: 09/30/18 12:00 AM  Result Value Ref Range   Chlamydia Negative     Comment: Normal Reference Range - Negative   Neisseria gonorrhea Negative     Comment: Normal Reference Range - Negative  CBC w/Diff/Platelet     Status: Abnormal   Collection Time: 09/30/18  8:53 AM  Result Value Ref Range   WBC 4.3 3.8 - 10.8 Thousand/uL   RBC 5.70 4.20 - 5.80 Million/uL   Hemoglobin 14.9 13.2 - 17.1 g/dL   HCT 44.3 38.5 - 50.0 %   MCV 77.7 (L) 80.0 - 100.0 fL   MCH 26.1 (L) 27.0 - 33.0 pg   MCHC 33.6 32.0 - 36.0 g/dL   RDW 13.1 11.0 - 15.0 %   Platelets 334 140 - 400 Thousand/uL   MPV 10.2 7.5 - 12.5 fL   Neutro Abs 2,107 1,500 - 7,800 cells/uL   Lymphs Abs 1,570 850 - 3,900 cells/uL   WBC mixed population 361 200 - 950 cells/uL   Eosinophils Absolute 224 15 - 500 cells/uL    Basophils Absolute 39 0 - 200 cells/uL   Neutrophils Relative % 49 %   Total Lymphocyte 36.5 %   Monocytes Relative 8.4 %   Eosinophils Relative 5.2 %   Basophils Relative 0.9 %  HIV Antibody (routine testing w rflx)     Status: None   Collection Time: 09/30/18  8:53 AM  Result Value Ref Range   HIV 1&2 Ab, 4th Generation NON-REACTIVE NON-REACTI    Comment: HIV-1 antigen and HIV-1/HIV-2 antibodies were not detected. There is no laboratory evidence of HIV infection. Marland Kitchen PLEASE NOTE: This information has been disclosed to you from records whose confidentiality may be protected by state law.  If your state requires such protection, then the state law prohibits you from making any further disclosure of the information without the specific written consent of the person to whom it pertains, or as otherwise permitted by law. A general authorization for the release of medical or other information is NOT sufficient for this purpose. . For additional information please refer to http://education.questdiagnostics.com/faq/FAQ106 (This link is being provided for informational/ educational purposes only.) . Marland Kitchen The performance of this assay has not been clinically validated in patients less than 34 years old. .   RPR     Status: None   Collection Time: 09/30/18  8:53 AM  Result Value Ref Range   RPR Ser Ql NON-REACTIVE NON-REACTI  Sed Rate (ESR)     Status: None   Collection Time: 10/02/18  8:40 AM  Result Value Ref Range   Sed Rate 2 0 - 15 mm/h  C-reactive protein     Status: None  Collection Time: 10/02/18  8:40 AM  Result Value Ref Range   CRP 6.8 <8.0 mg/L  Iron, TIBC and Ferritin Panel     Status: None   Collection Time: 10/02/18  8:40 AM  Result Value Ref Range   Iron 93 50 - 195 mcg/dL   TIBC 285 250 - 425 mcg/dL (calc)   %SAT 33 20 - 48 % (calc)   Ferritin 142 38 - 380 ng/mL  Thyroid Panel With TSH     Status: None   Collection Time: 10/02/18  8:40 AM  Result Value Ref  Range   T3 Uptake 32 22 - 35 %   T4, Total 7.9 4.9 - 10.5 mcg/dL   Free Thyroxine Index 2.5 1.4 - 3.8   TSH 1.91 0.40 - 4.50 mIU/L  Lipid panel     Status: Abnormal   Collection Time: 10/02/18  8:40 AM  Result Value Ref Range   Cholesterol 140 <200 mg/dL   HDL 35 (L) >40 mg/dL   Triglycerides 50 <150 mg/dL   LDL Cholesterol (Calc) 92 mg/dL (calc)    Comment: Reference range: <100 . Desirable range <100 mg/dL for primary prevention;   <70 mg/dL for patients with CHD or diabetic patients  with > or = 2 CHD risk factors. Marland Kitchen LDL-C is now calculated using the Martin-Hopkins  calculation, which is a validated novel method providing  better accuracy than the Friedewald equation in the  estimation of LDL-C.  Cresenciano Genre et al. Annamaria Helling. 6761;950(93): 2061-2068  (http://education.[facility].com/faq/FAQ164)    Total CHOL/HDL Ratio 4.0 <5.0 (calc)   Non-HDL Cholesterol (Calc) 105 <130 mg/dL (calc)    Comment: For patients with diabetes plus 1 major ASCVD risk  factor, treating to a non-HDL-C goal of <100 mg/dL  (LDL-C of <70 mg/dL) is considered a therapeutic  option.   COMPLETE METABOLIC PANEL WITH GFR     Status: None   Collection Time: 10/02/18  8:40 AM  Result Value Ref Range   Glucose, Bld 94 65 - 99 mg/dL    Comment: .            Fasting reference interval .    BUN 12 7 - 25 mg/dL   Creat 1.09 0.60 - 1.35 mg/dL   GFR, Est Non African American 92 > OR = 60 mL/min/1.59m   GFR, Est African American 106 > OR = 60 mL/min/1.72m  BUN/Creatinine Ratio NOT APPLICABLE 6 - 22 (calc)   Sodium 137 135 - 146 mmol/L   Potassium 4.2 3.5 - 5.3 mmol/L   Chloride 102 98 - 110 mmol/L   CO2 28 20 - 32 mmol/L   Calcium 9.4 8.6 - 10.3 mg/dL   Total Protein 7.1 6.1 - 8.1 g/dL   Albumin 4.4 3.6 - 5.1 g/dL   Globulin 2.7 1.9 - 3.7 g/dL (calc)   AG Ratio 1.6 1.0 - 2.5 (calc)   Total Bilirubin 0.5 0.2 - 1.2 mg/dL   Alkaline phosphatase (APISO) 61 40 - 115 U/L   AST 15 10 - 40 U/L   ALT 15 9  - 46 U/L  Hemoglobin A1c     Status: None   Collection Time: 10/02/18  8:40 AM  Result Value Ref Range   Hgb A1c MFr Bld 5.0 <5.7 % of total Hgb    Comment: For the purpose of screening for the presence of diabetes: . <5.7%       Consistent with the absence of diabetes 5.7-6.4%    Consistent with increased risk  for diabetes             (prediabetes) > or =6.5%  Consistent with diabetes . This assay result is consistent with a decreased risk of diabetes. . Currently, no consensus exists regarding use of hemoglobin A1c for diagnosis of diabetes in children. . According to American Diabetes Association (ADA) guidelines, hemoglobin A1c <7.0% represents optimal control in non-pregnant diabetic patients. Different metrics may apply to specific patient populations.  Standards of Medical Care in Diabetes(ADA). .    Mean Plasma Glucose 97 (calc)   eAG (mmol/L) 5.4 (calc)  Hepatic function panel     Status: None   Collection Time: 10/02/18  8:40 AM  Result Value Ref Range   Total Protein 7.1 6.1 - 8.1 g/dL   Albumin 4.4 3.6 - 5.1 g/dL   Globulin 2.7 1.9 - 3.7 g/dL (calc)   AG Ratio 1.6 1.0 - 2.5 (calc)   Total Bilirubin 0.5 0.2 - 1.2 mg/dL   Bilirubin, Direct 0.1 0.0 - 0.2 mg/dL   Indirect Bilirubin 0.4 0.2 - 1.2 mg/dL (calc)   Alkaline phosphatase (APISO) 61 40 - 115 U/L   AST 15 10 - 40 U/L   ALT 15 9 - 46 U/L  Sickle cell screen     Status: None   Collection Time: 10/02/18  8:40 AM  Result Value Ref Range   Sickle Solubility Test - HGBRFX NEGATIVE NEGATIVE    Comment: . Hemoglobin solubility testing alone is insufficient for detecting or confirming the presence of sickling hemoglobins in some situations. Additional testing may be required for diagnosis of hemoglobinopathies. For more information on this test go to: http://education.questdiagn[facility].com/faq/FAQ99v1 .   B12 and Folate Panel     Status: None   Collection Time: 10/02/18  8:40 AM  Result Value Ref Range    Vitamin B-12 411 200 - 1,100 pg/mL   Folate 13.6 ng/mL    Comment:                            Reference Range                            Low:           <3.4                            Borderline:    3.4-5.4                            Normal:        >5.4 .      PHQ2/9: Depression screen [facility] 2/9 09/30/2018 09/25/2018  Decreased Interest 0 1  Down, Depressed, Hopeless 0 0  PHQ - 2 Score 0 1  Altered sleeping 0 1  Tired, decreased energy 1 2  Change in appetite 0 1  Feeling bad or failure about yourself  0 0  Trouble concentrating 0 0  Moving slowly or fidgety/restless 0 0  Suicidal thoughts 0 0  PHQ-9 Score 1 5  Difficult doing work/chores - Somewhat difficult     Fall Risk: Fall Risk  10/16/2018 10/07/2018 09/30/2018 09/25/2018  Falls in the past year? 0 0 No No  Follow up - Falls evaluation completed - -    Assessment & Plan  1. Supraclavicular lymphadenopathy  Seen by surgeon, had [place] and  was given reassurance  2. Obesity (BMI 35.0-39.9 without comorbidity)  Discussed with the patient the risk posed by an increased BMI. Discussed importance of portion control, calorie counting and at least 150 minutes of physical activity weekly. Avoid sweet beverages and drink more water. Eat at least 6 servings of fruit and vegetables daily   3. Enlarged thyroid  No nodules on thyroid US  4. Bright red rectal bleeding  Waiting to see GI next week  5. Neck pain  Discussed stretching   6. Nasal congestion  - azelastine (ASTELIN) 0.1 % nasal spray; Place 1 spray into both nostrils 2 (two) times daily. Use in each nostril as directed  Dispense: 30 mL; Refill: 2  7. GERD without esophagitis  - famotidine (PEPCID) 40 MG tablet; Take 1 tablet (40 mg total) by mouth daily.  Dispense: 30 tablet; Refill: 2

## 2018-10-16 NOTE — Addendum Note (Signed)
Addended by: Steele Sizer F on: 10/16/2018 10:12 AM   Modules accepted: Orders

## 2018-10-16 NOTE — Patient Instructions (Signed)
Food Choices for Gastroesophageal Reflux Disease, Adult When you have gastroesophageal reflux disease (GERD), the foods you eat and your eating habits are very important. Choosing the right foods can help ease your discomfort. What guidelines do I need to follow?  Choose fruits, vegetables, whole grains, and low-fat dairy products.  Choose low-fat meat, fish, and poultry.  Limit fats such as oils, salad dressings, butter, nuts, and avocado.  Keep a food diary. This helps you identify foods that cause symptoms.  Avoid foods that cause symptoms. These may be different for everyone.  Eat small meals often instead of 3 large meals a day.  Eat your meals slowly, in a place where you are relaxed.  Limit fried foods.  Cook foods using methods other than frying.  Avoid drinking alcohol.  Avoid drinking large amounts of liquids with your meals.  Avoid bending over or lying down until 2-3 hours after eating. What foods are not recommended? These are some foods and drinks that may make your symptoms worse: Vegetables  Tomatoes. Tomato juice. Tomato and spaghetti sauce. Chili peppers. Onion and garlic. Horseradish. Fruits  Oranges, grapefruit, and lemon (fruit and juice). Meats  High-fat meats, fish, and poultry. This includes hot dogs, ribs, ham, sausage, salami, and bacon. Dairy  Whole milk and chocolate milk. Sour cream. Cream. Butter. Ice cream. Cream cheese. Drinks  Coffee and tea. Bubbly (carbonated) drinks or energy drinks. Condiments  Hot sauce. Barbecue sauce. Sweets/Desserts  Chocolate and cocoa. Donuts. Peppermint and spearmint. Fats and Oils  High-fat foods. This includes French fries and potato chips. Other  Vinegar. Strong spices. This includes black pepper, white pepper, red pepper, cayenne, curry powder, cloves, ginger, and chili powder. The items listed above may not be a complete list of foods and drinks to avoid. Contact your dietitian for more information.    This information is not intended to replace advice given to you by your health care provider. Make sure you discuss any questions you have with your health care provider. Document Released: 05/19/2012 Document Revised: 04/25/2016 Document Reviewed: 09/22/2013 Elsevier Interactive Patient Education  2017 Elsevier Inc.  

## 2018-10-19 ENCOUNTER — Ambulatory Visit: Payer: Self-pay | Admitting: Family Medicine

## 2018-10-22 ENCOUNTER — Ambulatory Visit (INDEPENDENT_AMBULATORY_CARE_PROVIDER_SITE_OTHER): Payer: BLUE CROSS/BLUE SHIELD | Admitting: Gastroenterology

## 2018-10-22 ENCOUNTER — Ambulatory Visit: Payer: BLUE CROSS/BLUE SHIELD | Admitting: Gastroenterology

## 2018-10-22 ENCOUNTER — Encounter: Payer: Self-pay | Admitting: Gastroenterology

## 2018-10-22 ENCOUNTER — Other Ambulatory Visit: Payer: Self-pay

## 2018-10-22 VITALS — BP 123/89 | HR 74 | Ht 73.0 in | Wt 281.0 lb

## 2018-10-22 DIAGNOSIS — K625 Hemorrhage of anus and rectum: Secondary | ICD-10-CM | POA: Diagnosis not present

## 2018-10-22 NOTE — Progress Notes (Signed)
Samuel Bellows MD, MRCP(U.K) [address]  [place]  [place], [place] [address]  Main: [phone]  Fax: [phone]   Gastroenterology Consultation  Referring Provider:     Hubbard Hartshorn, FNP Primary Care Physician:  Steele Sizer, MD Primary Gastroenterologist:  Dr. Jonathon Hendricks  Reason for Consultation:     Rectal bleeding         HPI:   Samuel Hendricks is a 27 y.o. y/o male referred for consultation & management  by Dr. Steele Sizer, MD.     Rectal bleeding :  Onset and where was blood seen  :for a few years on and off- has stopped after he tried suppositores, atleast ongoing for 5 years, he has seen blood on the toilet paper, seen some blood in the stool , a few weeks back was dripping blood Frequency of bowel movements :every day - morning  Consistency : varies - solid at times and at times looser , lately been green  Change in shape of stool:: no  Pain associated with bowel movements:constipated at times - last few months - has noticed more bleeding when he has been more constipated  Blood thinner usage:no  NSAID's: takes alka stzer plus Prior colonoscopy :no  Family history of colon cancer or polyps:unaware  Weight loss:no     Past Medical History:  Diagnosis Date  . Retina hole, right    Congential  . Seasonal allergies     Past Surgical History:  Procedure Laterality Date  . APPENDECTOMY  12/02/1997    Prior to Admission medications   Medication Sig Start Date End Date Taking? Authorizing Provider  azelastine (ASTELIN) 0.1 % nasal spray Place 1 spray into both nostrils 2 (two) times daily. Use in each nostril as directed 10/16/18  Yes Sowles, Drue Stager, MD  famotidine (PEPCID) 40 MG tablet Take 1 tablet (40 mg total) by mouth daily. 10/16/18 10/16/19 Yes Sowles, Drue Stager, MD  hydrocortisone (ANUSOL-HC) 25 MG suppository Place 1 suppository (25 mg total) rectally 2 (two) times daily. 09/30/18  Yes Hubbard Hartshorn, FNP    Family History  Problem  Relation Age of Onset  . Anemia Mother   . Diabetes Father   . Thyroid disease Sister   . Hypertension Maternal Grandmother   . Heart disease Paternal Grandmother   . Diabetes Paternal Grandfather   . Colon cancer Neg Hx      Social History   Tobacco Use  . Smoking status: Never Smoker  . Smokeless tobacco: Never Used  Substance Use Topics  . Alcohol use: Yes    Frequency: Never    Comment: rarely  . Drug use: Not Currently    Types: Marijuana    Comment: in the past    Allergies as of 10/22/2018  . (No Known Allergies)    Review of Systems:    All systems reviewed and negative except where noted in HPI.   Physical Exam:  BP 123/89   Pulse 74   Ht 6\' 1"  (1.854 m)   Wt 281 lb (127.5 kg)   BMI 37.07 kg/m  No LMP for male patient. Psych:  Alert and cooperative. Normal mood and affect. General:   Alert,  Well-developed, well-nourished, pleasant and cooperative in NAD Head:  Normocephalic and atraumatic. Eyes:  Sclera clear, no icterus.   Conjunctiva pink. Ears:  Normal auditory acuity. Nose:  No deformity, discharge, or lesions. Mouth:  No deformity or lesions,oropharynx pink & moist. Neck:  Supple; no masses or thyromegaly. Lungs:  Respirations even and unlabored.  Clear throughout to auscultation.   No wheezes, crackles, or rhonchi. No acute distress. Heart:  Regular rate and rhythm; no murmurs, clicks, rubs, or gallops. Abdomen:  Normal bowel sounds.  No bruits.  Soft, non-tender and non-distended without masses, hepatosplenomegaly or hernias noted.  No guarding or rebound tenderness.    Neurologic:  Alert and oriented x3;  grossly normal neurologically. Skin:  Intact without significant lesions or rashes. No jaundice. Lymph Nodes:  No significant cervical adenopathy. Psych:  Alert and cooperative. Normal mood and affect.  Imaging Studies: US Soft Tissue Head & Neck (non-thyroid)  Result Date: 10/08/2018 Ultrasound Head/Neck Date of Exam: 10/07/18  Indication(s): palpable left supraclavicular lymph node Prior Studies/Comparison: recent thyroid ultrasound Using the GE Logiq ultrasound machine and a 12 MHz linear array transducer, dedicated imaging of the head and neck of the patient was performed. Findings: Right Lobe: mildly enlarged, no concerning nodules or masses Left Lobe: mildly enlarged, no concerning nodules or masses Lymph Nodes: the bilateral jugular chain nodes were evaluated.  Multiple normal-appearing nodes were present bilaterally.  In the area of palpable concern, just over the left clavicle, there is an intradermal lymph node measuring 0.72 x 1.6 x 0.72 cm. There is a normal fatty hilum present. No microcalcifications or cystic degeneration seen. Impression: benign-appearing intradermal lymph node at the upper limits of normal size.  No concerning features.   US Thyroid  Result Date: 10/06/2018 CLINICAL DATA:  Palpable abnormality. 27 year old male with thyromegaly on physical exam. Family history of thyroid disease. EXAM: THYROID ULTRASOUND TECHNIQUE: Ultrasound examination of the thyroid gland and adjacent soft tissues was performed. COMPARISON:  None. FINDINGS: Parenchymal Echotexture: Mildly heterogenous Isthmus: 0.5 cm Right lobe: 5.2 x 2 x 2 cm Left lobe: 4.8 x 2 x 1.8 cm _________________________________________________________ Estimated total number of nodules >/= 1 cm: 0 Number of spongiform nodules >/=  2 cm not described below (TR1): 0 Number of mixed cystic and solid nodules >/= 1.5 cm not described below (TR2): 0 _________________________________________________________ No discrete nodules are seen within the thyroid gland. IMPRESSION: Mildly heterogeneous and enlarged thyroid gland. No thyroid nodules identified. Electronically Signed   By: Jacqulynn Cadet M.D.   On: 10/06/2018 16:01    Assessment and Plan:   Samuel Hendricks is a 27 y.o. y/o male has been referred for rectal bleeding  I have discussed alternative  options, risks & benefits,  which include, but are not limited to, bleeding, infection, perforation,respiratory complication & drug reaction.  The patient agrees with this plan & written consent will be obtained.    Follow up in 4 weeks  Dr Samuel Bellows MD,MRCP(U.K)

## 2018-10-27 ENCOUNTER — Telehealth: Payer: Self-pay | Admitting: Gastroenterology

## 2018-10-27 NOTE — Telephone Encounter (Signed)
-----   Message from Steele Sizer, MD sent at 10/27/2018  1:29 PM EST ----- Regarding: RE: Medical clearance for procedure Palpitation resolved he may proceed with colonoscopy  ----- Message ----- From: Rushie Chestnut, CMA Sent: 10/27/2018  12:06 PM EST To: Steele Sizer, MD Subject: Medical clearance for procedure                Hi Dr. Ancil Boozer,  Dr. Vicente Males ([facility]) seen pt Samuel Hendricks in the office last week for rectal bleeding, Dr. Vicente Males wants perform a colonoscopy to evaluate. However, I noticed the pt was seen in the ED for heart palpitations and was scheduled to follow up with cardiology. I brought this to [name] attention and mentioned that we would need cardiac clearance before we can proceed with the procedure. He then stated that he's not going to see the cardiologist because he no longer has symptoms. Pt also states he spoke with you and was told that the symptoms were just related to anxiety and stress. Dr. Vicente Males is requesting your opinion on whether pt would need cardiac evaluation or would pt be okay to proceed with the colonoscopy?  Please advise.  Micael Hampshire., CMA

## 2018-10-27 NOTE — Telephone Encounter (Signed)
Spoke with pt and informed him that his PCP states pt is cleared to proceed with colonoscopy. Pt colonoscopy has been scheduled.

## 2018-10-27 NOTE — Telephone Encounter (Signed)
Pt is  Calling to check on status of [place] getting in touch with his PCP to check on scheduling a colonoscopy

## 2018-11-02 ENCOUNTER — Ambulatory Visit: Payer: BLUE CROSS/BLUE SHIELD | Admitting: Anesthesiology

## 2018-11-02 ENCOUNTER — Encounter: Payer: Self-pay | Admitting: *Deleted

## 2018-11-02 ENCOUNTER — Ambulatory Visit
Admission: RE | Admit: 2018-11-02 | Discharge: 2018-11-02 | Disposition: A | Discharge status: Home / Self Care | Payer: BLUE CROSS/BLUE SHIELD | Source: Ambulatory Visit | Attending: Gastroenterology | Admitting: Gastroenterology

## 2018-11-02 ENCOUNTER — Encounter
Admission: RE | Disposition: A | Discharge status: Home / Self Care | Payer: Self-pay | Source: Ambulatory Visit | Attending: Gastroenterology

## 2018-11-02 DIAGNOSIS — K640 First degree hemorrhoids: Secondary | ICD-10-CM | POA: Insufficient documentation

## 2018-11-02 DIAGNOSIS — D122 Benign neoplasm of ascending colon: Secondary | ICD-10-CM | POA: Insufficient documentation

## 2018-11-02 DIAGNOSIS — D125 Benign neoplasm of sigmoid colon: Secondary | ICD-10-CM | POA: Diagnosis not present

## 2018-11-02 DIAGNOSIS — K625 Hemorrhage of anus and rectum: Secondary | ICD-10-CM | POA: Insufficient documentation

## 2018-11-02 DIAGNOSIS — K649 Unspecified hemorrhoids: Secondary | ICD-10-CM | POA: Diagnosis not present

## 2018-11-02 DIAGNOSIS — K635 Polyp of colon: Secondary | ICD-10-CM | POA: Diagnosis not present

## 2018-11-02 HISTORY — PX: COLONOSCOPY WITH PROPOFOL: SHX5780

## 2018-11-02 SURGERY — COLONOSCOPY WITH PROPOFOL
Anesthesia: General

## 2018-11-02 MED ORDER — MIDAZOLAM HCL 2 MG/2ML IJ SOLN
INTRAMUSCULAR | Status: AC
  Filled 2018-11-02: qty 2

## 2018-11-02 MED ORDER — PROPOFOL 500 MG/50ML IV EMUL
INTRAVENOUS | Status: DC | PRN
  Administered 2018-11-02: 140 ug/kg/min via INTRAVENOUS

## 2018-11-02 MED ORDER — PROPOFOL 10 MG/ML IV BOLUS
INTRAVENOUS | Status: AC
  Filled 2018-11-02: qty 20

## 2018-11-02 MED ORDER — FENTANYL CITRATE (PF) 100 MCG/2ML IJ SOLN
INTRAMUSCULAR | Status: AC
  Filled 2018-11-02: qty 2

## 2018-11-02 MED ORDER — MIDAZOLAM HCL 2 MG/2ML IJ SOLN
INTRAMUSCULAR | Status: DC | PRN
  Administered 2018-11-02: 2 mg via INTRAVENOUS

## 2018-11-02 MED ORDER — PROPOFOL 500 MG/50ML IV EMUL
INTRAVENOUS | Status: AC
  Filled 2018-11-02: qty 50

## 2018-11-02 MED ORDER — SODIUM CHLORIDE 0.9 % IV SOLN
INTRAVENOUS | Status: DC
  Administered 2018-11-02: 1000 mL via INTRAVENOUS

## 2018-11-02 MED ORDER — PROPOFOL 10 MG/ML IV BOLUS
INTRAVENOUS | Status: DC | PRN
  Administered 2018-11-02: 90 mg via INTRAVENOUS

## 2018-11-02 MED ORDER — FENTANYL CITRATE (PF) 100 MCG/2ML IJ SOLN
INTRAMUSCULAR | Status: DC | PRN
  Administered 2018-11-02: 50 ug via INTRAVENOUS

## 2018-11-02 NOTE — H&P (Signed)
Jonathon Bellows, MD [address] [place], [place], [place], [address] [id number], [place], [place], [place], [address] Phone: [phone]  Fax: [phone]  Primary Care Physician:  Steele Sizer, MD   Pre-Procedure History & Physical: HPI:  Khalif BERNADETTE ARMIJO is a 27 y.o. male is here for an colonoscopy.   Past Medical History:  Diagnosis Date  . Retina hole, right    Congential  . Seasonal allergies     Past Surgical History:  Procedure Laterality Date  . APPENDECTOMY  12/02/1997    Prior to Admission medications   Medication Sig Start Date End Date Taking? Authorizing Provider  azelastine (ASTELIN) 0.1 % nasal spray Place 1 spray into both nostrils 2 (two) times daily. Use in each nostril as directed 10/16/18   Steele Sizer, MD  famotidine (PEPCID) 40 MG tablet Take 1 tablet (40 mg total) by mouth daily. 10/16/18 10/16/19  Steele Sizer, MD  hydrocortisone (ANUSOL-HC) 25 MG suppository Place 1 suppository (25 mg total) rectally 2 (two) times daily. 09/30/18   Hubbard Hartshorn, FNP    Allergies as of 10/27/2018  . (No Known Allergies)    Family History  Problem Relation Age of Onset  . Anemia Mother   . Diabetes Father   . Thyroid disease Sister   . Hypertension Maternal Grandmother   . Heart disease Paternal Grandmother   . Diabetes Paternal Grandfather   . Colon cancer Neg Hx     Social History   Socioeconomic History  . Marital status: Single    Spouse name: Not on file  . Number of children: 0  . Years of education: Not on file  . Highest education level: Bachelor's degree (e.g., BA, AB, BS)  Occupational History  . Occupation: Admission Counselor    Comment: [employer]  . Financial resource strain: Not hard at all  . Food insecurity:    Worry: Never true    Inability: Never true  . Transportation needs:    Medical: No    Non-medical: No  Tobacco Use  . Smoking status: Never Smoker  . Smokeless tobacco: Never  Used  Substance and Sexual Activity  . Alcohol use: Yes    Frequency: Never    Comment: rarely  . Drug use: Not Currently    Types: Marijuana    Comment: in the past  . Sexual activity: Yes    Partners: Female    Birth control/protection: Condom  Lifestyle  . Physical activity:    Days per week: 1 day    Minutes per session: 40 min  . Stress: Only a little  Relationships  . Social connections:    Talks on phone: More than three times a week    Gets together: Three times a week    Attends religious service: More than 4 times per year    Active member of club or organization: Yes    Attends meetings of clubs or organizations: More than 4 times per year    Relationship status: Never married  . Intimate partner violence:    Fear of current or ex partner: No    Emotionally abused: No    Physically abused: No    Forced sexual activity: No  Other Topics Concern  . Not on file  Social History Narrative   Graduated from [employer] in [employer]   Currently working at [employer]: See HPI, otherwise negative ROS  Physical  Exam: There were no vitals taken for this visit. General:   Alert,  pleasant and cooperative in NAD Head:  Normocephalic and atraumatic. Neck:  Supple; no masses or thyromegaly. Lungs:  Clear throughout to auscultation, normal respiratory effort.    Heart:  +S1, +S2, Regular rate and rhythm, No edema. Abdomen:  Soft, nontender and nondistended. Normal bowel sounds, without guarding, and without rebound.   Neurologic:  Alert and  oriented x4;  grossly normal neurologically.  Impression/Plan: Tannen BERNARDINO DOWELL is here for an colonoscopy to be performed for rectal bleeding  Risks, benefits, limitations, and alternatives regarding  colonoscopy have been reviewed with the patient.  Questions have been answered.  All parties agreeable.   Jonathon Bellows, MD  11/02/2018, 10:19 AM

## 2018-11-02 NOTE — Anesthesia Preprocedure Evaluation (Addendum)
Anesthesia Evaluation  Patient identified by MRN, date of birth, ID band Patient awake    Reviewed: Allergy & Precautions, H&P , NPO status , Patient's Chart, lab work & pertinent test results  Airway Mallampati: III       Dental  (+) Teeth Intact   Pulmonary neg pulmonary ROS, neg COPD,           Cardiovascular (-) angina+ dysrhythmias (occasional palpitations)      Neuro/Psych negative neurological ROS  negative psych ROS   GI/Hepatic negative GI ROS, Neg liver ROS,   Endo/Other  negative endocrine ROS  Renal/GU negative Renal ROS  negative genitourinary   Musculoskeletal   Abdominal   Peds  Hematology negative hematology ROS (+)   Anesthesia Other Findings Past Medical History: No date: Retina hole, right     Comment:  Congential No date: Seasonal allergies  Past Surgical History: 12/02/1997: APPENDECTOMY  BMI    Body Mass Index:  36.94 kg/m      Reproductive/Obstetrics negative OB ROS                            Anesthesia Physical Anesthesia Plan  ASA: I  Anesthesia Plan: General   Post-op Pain Management:    Induction:   PONV Risk Score and Plan: Propofol infusion and TIVA  Airway Management Planned: Natural Airway and Nasal Cannula  Additional Equipment:   Intra-op Plan:   Post-operative Plan:   Informed Consent: I have reviewed the patients History and Physical, chart, labs and discussed the procedure including the risks, benefits and alternatives for the proposed anesthesia with the patient or authorized representative who has indicated his/her understanding and acceptance.   Dental Advisory Given  Plan Discussed with: Anesthesiologist  Anesthesia Plan Comments:         Anesthesia Quick Evaluation

## 2018-11-02 NOTE — Transfer of Care (Signed)
Immediate Anesthesia Transfer of Care Note  Patient: Samuel Hendricks  Procedure(s) Performed: COLONOSCOPY WITH PROPOFOL (N/A )  Patient Location: Endoscopy Unit  Anesthesia Type:General  Level of Consciousness: drowsy and patient cooperative  Airway & Oxygen Therapy: Patient Spontanous Breathing and Patient connected to nasal cannula oxygen  Post-op Assessment: Report given to RN and Post -op Vital signs reviewed and stable  Post vital signs: Reviewed and stable  Last Vitals:  Vitals Value Taken Time  BP 95/75 11/02/2018 11:20 AM  Temp 36.1 C 11/02/2018 11:19 AM  Pulse 77 11/02/2018 11:22 AM  Resp 15 11/02/2018 11:22 AM  SpO2 100 % 11/02/2018 11:22 AM  Vitals shown include unvalidated device data.  Last Pain:  Vitals:   11/02/18 1119  TempSrc: Tympanic  PainSc: 0-No pain         Complications: No apparent anesthesia complications

## 2018-11-02 NOTE — Anesthesia Post-op Follow-up Note (Signed)
Anesthesia QCDR form completed.        

## 2018-11-02 NOTE — Op Note (Signed)
[facility] Patient Name: Samuel Hendricks Procedure Date: 11/02/2018 10:51 AM MRN: [id number] Account #: [id number] Date of Birth: [birth year] Admit Type: Outpatient Age: 27 Room: [facility] ENDO ROOM 4 Gender: Male Note Status: Finalized Procedure:            Colonoscopy Indications:          Rectal bleeding Providers:            Jonathon Bellows MD, MD Referring MD:         Bethena Roys. Sowles, MD (Referring MD) Medicines:            Monitored Anesthesia Care Complications:        No immediate complications. Procedure:            Pre-Anesthesia Assessment:                       - Prior to the procedure, a History and Physical was                        performed, and patient medications, allergies and                        sensitivities were reviewed. The patient's tolerance of                        previous anesthesia was reviewed.                       - The risks and benefits of the procedure and the                        sedation options and risks were discussed with the                        patient. All questions were answered and informed                        consent was obtained.                       - ASA Grade Assessment: II - A patient with mild                        systemic disease.                       After obtaining informed consent, the colonoscope was                        passed under direct vision. Throughout the procedure,                        the patient's blood pressure, pulse, and oxygen                        saturations were monitored continuously. The                        Colonoscope was introduced through the anus and                        advanced  to the the cecum, identified by the                        appendiceal orifice, IC valve and transillumination.                        The colonoscopy was performed with ease. The patient                        tolerated the procedure well. The quality of the bowel                   preparation was good. Findings:      The perianal and digital rectal examinations were normal.      Non-bleeding internal hemorrhoids were found during retroflexion. The       hemorrhoids were medium-sized and Grade I (internal hemorrhoids that do       not prolapse).      Two sessile polyps were found in the sigmoid colon and ascending colon.       The polyps were 5 to 6 mm in size. These polyps were removed with a cold       snare. Resection and retrieval were complete.      A 3 mm polyp was found in the sigmoid colon. The polyp was sessile. The       polyp was removed with a cold biopsy forceps. Resection and retrieval       were complete.      A 12 mm polypoid lesion was found in the colon. The lesion was       semi-pedunculated. No bleeding was present. Did not resect      The exam was otherwise without abnormality on direct and retroflexion       views. Impression:           - Non-bleeding internal hemorrhoids.                       - Two 5 to 6 mm polyps in the sigmoid colon and in the                        ascending colon, removed with a cold snare. Resected                        and retrieved.                       - One 3 mm polyp in the sigmoid colon, removed with a                        cold biopsy forceps. Resected and retrieved.                       - Polypoid lesion.                       - The examination was otherwise normal on direct and                        retroflexion views. Recommendation:       - Discharge patient to home (with escort).                       -  Resume previous diet.                       - Continue present medications.                       - Await pathology results.                       - I will discuss with [facility]oscopists at                        [facility] if they would be willing to attempt                        endoscopic ultrasound followed by full thickness                        resection or using Ovesco  clip. The is unlikely to be                        an inverted appendix as he has had an appendectomy .                        normal overlying mucosa ?submucosal lesion :                        Differentials include carcinoid vs lipoma Procedure Code(s):    --- Professional ---                       [phone], Colonoscopy, flexible; with removal of tumor(s),                        polyp(s), or other lesion(s) by snare technique                       45380, 20, Colonoscopy, flexible; with biopsy, single                        or multiple Diagnosis Code(s):    --- Professional ---                       D12.5, Benign neoplasm of sigmoid colon                       D12.2, Benign neoplasm of ascending colon                       D49.0, Neoplasm of unspecified behavior of digestive                        system                       K64.0, First degree hemorrhoids                       K62.5, Hemorrhage of anus and rectum CPT copyright 2018 American Medical Association. All rights reserved. The codes documented in this report are preliminary and upon coder review may  be revised to meet current compliance requirements. Jonathon Bellows, MD Jonathon Bellows MD, MD 11/02/2018 11:19:52  AM This report has been signed electronically. Number of Addenda: 0 Note Initiated On: 11/02/2018 10:51 AM Scope Withdrawal Time: 0 hours 15 minutes 26 seconds  Total Procedure Duration: 0 hours 17 minutes 9 seconds       [facility]

## 2018-11-03 LAB — SURGICAL PATHOLOGY

## 2018-11-03 NOTE — Anesthesia Postprocedure Evaluation (Signed)
Anesthesia Post Note  Patient: Samuel Hendricks  Procedure(s) Performed: COLONOSCOPY WITH PROPOFOL (N/A )  Patient location during evaluation: PACU Anesthesia Type: General Level of consciousness: awake and alert Pain management: pain level controlled Vital Signs Assessment: post-procedure vital signs reviewed and stable Respiratory status: spontaneous breathing, nonlabored ventilation, respiratory function stable and patient connected to nasal cannula oxygen Cardiovascular status: blood pressure returned to baseline and stable Postop Assessment: no apparent nausea or vomiting Anesthetic complications: no     Last Vitals:  Vitals:   11/02/18 1119 11/02/18 1125  BP: (!) 90/55 109/60  Pulse:    Resp:    Temp: (!) 36.1 C   SpO2: 99%     Last Pain:  Vitals:   11/02/18 1131  TempSrc:   PainSc: 0-No pain                 Durenda Hurt

## 2018-11-04 ENCOUNTER — Encounter: Payer: Self-pay | Admitting: Gastroenterology

## 2018-11-11 ENCOUNTER — Ambulatory Visit (INDEPENDENT_AMBULATORY_CARE_PROVIDER_SITE_OTHER): Payer: BLUE CROSS/BLUE SHIELD | Admitting: Family Medicine

## 2018-11-11 ENCOUNTER — Telehealth: Payer: Self-pay

## 2018-11-11 ENCOUNTER — Other Ambulatory Visit: Payer: Self-pay

## 2018-11-11 ENCOUNTER — Encounter: Payer: Self-pay | Admitting: Family Medicine

## 2018-11-11 VITALS — BP 110/80 | HR 75 | Temp 98.1°F | Resp 16 | Ht 73.0 in | Wt 280.9 lb

## 2018-11-11 DIAGNOSIS — R590 Localized enlarged lymph nodes: Secondary | ICD-10-CM | POA: Diagnosis not present

## 2018-11-11 DIAGNOSIS — Z0000 Encounter for general adult medical examination without abnormal findings: Secondary | ICD-10-CM | POA: Diagnosis not present

## 2018-11-11 DIAGNOSIS — K625 Hemorrhage of anus and rectum: Secondary | ICD-10-CM

## 2018-11-11 NOTE — Telephone Encounter (Signed)
Spoke with pt and informed him of his appointment for the CT of abdomen/pelvis. Pt is aware of appointment date, time, location, and prior prep instructions.

## 2018-11-11 NOTE — Progress Notes (Signed)
Name: Samuel Hendricks   MRN: [id number]    DOB: 1991/04/02   Date:11/11/2018       Progress Note  Subjective  Chief Complaint  Chief Complaint  Patient presents with  . Annual Exam    HPI  Patient presents for annual CPE .  USPSTF grade A and B recommendations:  Diet:cutting down on fried food, eating vegetables daily, he will try to cut down on sweets Exercise: one day a week, but he will try to increase   Depression:  Depression screen [facility] 2/9 11/11/2018 09/30/2018 09/25/2018  Decreased Interest 0 0 1  Down, Depressed, Hopeless 0 0 0  PHQ - 2 Score 0 0 1  Altered sleeping 1 0 1  Tired, decreased energy 0 1 2  Change in appetite 0 0 1  Feeling bad or failure about yourself  0 0 0  Trouble concentrating 0 0 0  Moving slowly or fidgety/restless 0 0 0  Suicidal thoughts 0 0 0  PHQ-9 Score 1 1 5   Difficult doing work/chores Not difficult at all - Somewhat difficult   GAD 7 : Generalized Anxiety Score 11/11/2018 09/25/2018  Nervous, Anxious, on Edge 1 1  Control/stop worrying 0 0  Worry too much - different things 0 0  Trouble relaxing 0 0  Restless 0 0  Easily annoyed or irritable 0 0  Afraid - awful might happen 0 0  Total GAD 7 Score 1 1  Anxiety Difficulty Not difficult at all -     Hypertension:  BP Readings from Last 3 Encounters:  11/11/18 110/80  11/02/18 109/60  10/22/18 123/89    Obesity: Wt Readings from Last 3 Encounters:  11/11/18 280 lb 14.4 oz (127.4 kg)  11/02/18 280 lb (127 kg)  10/22/18 281 lb (127.5 kg)   BMI Readings from Last 3 Encounters:  11/11/18 37.06 kg/m  11/02/18 36.94 kg/m  10/22/18 37.07 kg/m     Lipids:  Lab Results  Component Value Date   CHOL 140 10/02/2018   Lab Results  Component Value Date   HDL 35 (L) 10/02/2018   Lab Results  Component Value Date   LDLCALC 92 10/02/2018   Lab Results  Component Value Date   TRIG 50 10/02/2018   Lab Results  Component Value Date   CHOLHDL 4.0 10/02/2018   No  results found for: LDLDIRECT Glucose:  Glucose, Bld  Date Value Ref Range Status  10/02/2018 94 65 - 99 mg/dL Final    Comment:    .            Fasting reference interval .   09/07/2018 99 70 - 99 mg/dL Final      [facility] Visit from 11/11/2018 in [facility]  AUDIT-C Score  0     Single but dating for the past 4 years  STD testing and prevention (HIV/chl/gon/syphilis): up to date  Hep C: negative screen   Skin cancer: discussed atypical lesions Colorectal cancer: had colonoscopy for evaluation of rectal bleeding , has hemorrhoids and also polypectomy, having it repeated in 6 months   IPSS Questionnaire (AUA-7): Over the past month.   1)  How often have you had a sensation of not emptying your bladder completely after you finish urinating?  0 - Not at all  2)  How often have you had to urinate again less than two hours after you finished urinating? 0- Not at all   3)  How often have you found you stopped and started  again several times when you urinated?  0 - Not at all  4) How difficult have you found it to postpone urination?  0 - Not at all  5) How often have you had a weak urinary stream?  0 - Not at all  6) How often have you had to push or strain to begin urination?  0 - Not at all  7) How many times did you most typically get up to urinate from the time you went to bed until the time you got up in the morning?  2 - 2 times  Total score:  0-7 mildly symptomatic   8-19 moderately symptomatic   20-35 severely symptomatic     Advanced Care Planning: A voluntary discussion about advance care planning including the explanation and discussion of advance directives.  Discussed health care proxy and Living will, and the patient was able to identify a health care proxy as mother - Shade Flood .  Patient does not have a living will at present time.  Patient Active Problem List   Diagnosis Date Noted  . Goiter diffuse 10/06/2018  . Lymphadenopathy of left  cervical region 09/30/2018  . Bright red rectal bleeding 09/30/2018    Past Surgical History:  Procedure Laterality Date  . APPENDECTOMY  12/02/1997  . COLONOSCOPY WITH PROPOFOL N/A 11/02/2018   Procedure: COLONOSCOPY WITH PROPOFOL;  Surgeon: Jonathon Bellows, MD;  Location: [facility] ENDOSCOPY;  Service: Gastroenterology;  Laterality: N/A;    Family History  Problem Relation Age of Onset  . Anemia Mother   . Diabetes Father   . Thyroid disease Sister   . Hypertension Maternal Grandmother   . Heart disease Paternal Grandmother   . Diabetes Paternal Grandfather   . Colon cancer Neg Hx     Social History   Socioeconomic History  . Marital status: Single    Spouse name: Not on file  . Number of children: 0  . Years of education: Not on file  . Highest education level: Bachelor's degree (e.g., BA, AB, BS)  Occupational History  . Occupation: Admission Counselor    Comment: [employer]  . Financial resource strain: Not hard at all  . Food insecurity:    Worry: Never true    Inability: Never true  . Transportation needs:    Medical: No    Non-medical: No  Tobacco Use  . Smoking status: Never Smoker  . Smokeless tobacco: Never Used  Substance and Sexual Activity  . Alcohol use: Yes    Frequency: Never    Comment: rarely  . Drug use: Not Currently    Types: Marijuana    Comment: 7 months  . Sexual activity: Yes    Partners: Female    Birth control/protection: Condom  Lifestyle  . Physical activity:    Days per week: 1 day    Minutes per session: 40 min  . Stress: To some extent  Relationships  . Social connections:    Talks on phone: More than three times a week    Gets together: Three times a week    Attends religious service: More than 4 times per year    Active member of club or organization: Yes    Attends meetings of clubs or organizations: More than 4 times per year    Relationship status: Living with partner  . Intimate partner violence:     Fear of current or ex partner: No    Emotionally abused: No    Physically  abused: No    Forced sexual activity: No  Other Topics Concern  . Not on file  Social History Narrative   Graduated from [employer] in [employer]   Currently working at [employer]      Current Outpatient Medications:  .  azelastine (ASTELIN) 0.1 % nasal spray, Place 1 spray into both nostrils 2 (two) times daily. Use in each nostril as directed, Disp: 30 mL, Rfl: 2 .  famotidine (PEPCID) 40 MG tablet, Take 1 tablet (40 mg total) by mouth daily., Disp: 30 tablet, Rfl: 2 .  hydrocortisone (ANUSOL-HC) 25 MG suppository, Place 1 suppository (25 mg total) rectally 2 (two) times daily. (Patient not taking: Reported on 11/11/2018), Disp: 12 suppository, Rfl: 0  No Known Allergies   ROS  Constitutional: Negative for fever or weight change.  Respiratory: Negative for cough and shortness of breath.   Cardiovascular: Negative for chest pain or palpitations.  Gastrointestinal: Negative for abdominal pain, no bowel changes.  Musculoskeletal: Negative for gait problem or joint swelling.  Skin: Negative for rash.  Neurological: Negative for dizziness or headache.  No other specific complaints in a complete review of systems (except as listed in HPI above).   Objective  Vitals:   11/11/18 1113  BP: 110/80  Pulse: 75  Resp: 16  Temp: 98.1 F (36.7 C)  TempSrc: Oral  SpO2: 99%  Weight: 280 lb 14.4 oz (127.4 kg)  Height: 6' 1"  (1.854 m)    Body mass index is 37.06 kg/m.  Physical Exam  Constitutional: Patient appears well-developed and well-nourished. No distress.  HENT: Head: Normocephalic and atraumatic. Ears: B TMs ok, no erythema or effusion; Nose: Nose normal. Mouth/Throat: Oropharynx is clear and moist. No oropharyngeal exudate.  Eyes: Conjunctivae and EOM are normal. Pupils are equal, round, and reactive to light. No scleral icterus.  Neck: Normal range of motion. Neck supple. No JVD present.  Mild thyromegaly, small supraclavicular lymphadenopathy, [place] normal Cardiovascular: Normal rate, regular rhythm and normal heart sounds.  No murmur heard. No BLE edema. Pulmonary/Chest: Effort normal and breath sounds normal. No respiratory distress. Abdominal: Soft. Bowel sounds are normal, no distension. There is no tenderness. no masses MALE GENITALIA: Normal descended testes bilaterally, no masses palpated, no hernias, no lesions, no discharge RECTAL: not done  Musculoskeletal: Normal range of motion, no joint effusions. No gross deformities Neurological: he is alert and oriented to person, place, and time. No cranial nerve deficit. Coordination, balance, strength, speech and gait are normal.  Skin: Skin is warm and dry. No rash noted. No erythema.  Psychiatric: Patient has a normal mood and affect. behavior is normal. Judgment and thought content normal.  Recent Results (from the past 2160 hour(s))  Basic metabolic panel     Status: None   Collection Time: 09/07/18  5:44 PM  Result Value Ref Range   Sodium 137 135 - 145 mmol/L   Potassium 3.8 3.5 - 5.1 mmol/L   Chloride 103 98 - 111 mmol/L   CO2 27 22 - 32 mmol/L   Glucose, Bld 99 70 - 99 mg/dL   BUN 15 6 - 20 mg/dL   Creatinine, Ser 1.15 0.61 - 1.24 mg/dL   Calcium 9.0 8.9 - 10.3 mg/dL   GFR calc non Af Amer >60 >60 mL/min   GFR calc Af Amer >60 >60 mL/min    Comment: (NOTE) The eGFR has been calculated using the CKD EPI equation. This calculation has not been validated in all clinical situations. eGFR's persistently <60 mL/min  signify possible Chronic Kidney Disease.    Anion gap 7 5 - 15    Comment: Performed at [facility], [address] [place], [place] [address]  CBC     Status: Abnormal   Collection Time: 09/07/18  5:44 PM  Result Value Ref Range   WBC 7.5 3.8 - 10.6 K/uL   RBC 5.61 4.40 - 5.90 MIL/uL   Hemoglobin 14.5 13.0 - 18.0 g/dL   HCT 43.8 40.0 - 52.0 %   MCV 78.1 (L) 80.0 - 100.0 fL   MCH 25.9  (L) 26.0 - 34.0 pg   MCHC 33.1 32.0 - 36.0 g/dL   RDW 13.8 11.5 - 14.5 %   Platelets 309 150 - 440 K/uL    Comment: Performed at [facility], [address] [place], [place] [address]  Troponin I     Status: None   Collection Time: 09/07/18  5:44 PM  Result Value Ref Range   Troponin I <0.03 <0.03 ng/mL    Comment: Performed at [facility], [address] [place], [place] [address]  Cytology (oral, anal, urethral) ancillary only     Status: None   Collection Time: 09/30/18 12:00 AM  Result Value Ref Range   Chlamydia Negative     Comment: Normal Reference Range - Negative   Neisseria gonorrhea Negative     Comment: Normal Reference Range - Negative  CBC w/Diff/Platelet     Status: Abnormal   Collection Time: 09/30/18  8:53 AM  Result Value Ref Range   WBC 4.3 3.8 - 10.8 Thousand/uL   RBC 5.70 4.20 - 5.80 Million/uL   Hemoglobin 14.9 13.2 - 17.1 g/dL   HCT 44.3 38.5 - 50.0 %   MCV 77.7 (L) 80.0 - 100.0 fL   MCH 26.1 (L) 27.0 - 33.0 pg   MCHC 33.6 32.0 - 36.0 g/dL   RDW 13.1 11.0 - 15.0 %   Platelets 334 140 - 400 Thousand/uL   MPV 10.2 7.5 - 12.5 fL   Neutro Abs 2,107 1,500 - 7,800 cells/uL   Lymphs Abs 1,570 850 - 3,900 cells/uL   WBC mixed population 361 200 - 950 cells/uL   Eosinophils Absolute 224 15 - 500 cells/uL   Basophils Absolute 39 0 - 200 cells/uL   Neutrophils Relative % 49 %   Total Lymphocyte 36.5 %   Monocytes Relative 8.4 %   Eosinophils Relative 5.2 %   Basophils Relative 0.9 %  HIV Antibody (routine testing w rflx)     Status: None   Collection Time: 09/30/18  8:53 AM  Result Value Ref Range   HIV 1&2 Ab, 4th Generation NON-REACTIVE NON-REACTI    Comment: HIV-1 antigen and HIV-1/HIV-2 antibodies were not detected. There is no laboratory evidence of HIV infection. Marland Kitchen PLEASE NOTE: This information has been disclosed to you from records whose confidentiality may be protected by state law.  If your state requires such protection,  then the state law prohibits you from making any further disclosure of the information without the specific written consent of the person to whom it pertains, or as otherwise permitted by law. A general authorization for the release of medical or other information is NOT sufficient for this purpose. . For additional information please refer to http://education.[facility].com/faq/FAQ106 (This link is being provided for informational/ educational purposes only.) . Marland Kitchen The performance of this assay has not been clinically validated in patients less than 64 years old. .   RPR     Status:  None   Collection Time: 09/30/18  8:53 AM  Result Value Ref Range   RPR Ser Ql NON-REACTIVE NON-REACTI  Sed Rate (ESR)     Status: None   Collection Time: 10/02/18  8:40 AM  Result Value Ref Range   Sed Rate 2 0 - 15 mm/h  C-reactive protein     Status: None   Collection Time: 10/02/18  8:40 AM  Result Value Ref Range   CRP 6.8 <8.0 mg/L  Iron, TIBC and Ferritin Panel     Status: None   Collection Time: 10/02/18  8:40 AM  Result Value Ref Range   Iron 93 50 - 195 mcg/dL   TIBC 285 250 - 425 mcg/dL (calc)   %SAT 33 20 - 48 % (calc)   Ferritin 142 38 - 380 ng/mL  Thyroid Panel With TSH     Status: None   Collection Time: 10/02/18  8:40 AM  Result Value Ref Range   T3 Uptake 32 22 - 35 %   T4, Total 7.9 4.9 - 10.5 mcg/dL   Free Thyroxine Index 2.5 1.4 - 3.8   TSH 1.91 0.40 - 4.50 mIU/L  Lipid panel     Status: Abnormal   Collection Time: 10/02/18  8:40 AM  Result Value Ref Range   Cholesterol 140 <200 mg/dL   HDL 35 (L) >40 mg/dL   Triglycerides 50 <150 mg/dL   LDL Cholesterol (Calc) 92 mg/dL (calc)    Comment: Reference range: <100 . Desirable range <100 mg/dL for primary prevention;   <70 mg/dL for patients with CHD or diabetic patients  with > or = 2 CHD risk factors. Marland Kitchen LDL-C is now calculated using the Martin-Hopkins  calculation, which is a validated novel method providing   better accuracy than the Friedewald equation in the  estimation of LDL-C.  Cresenciano Genre et al. Annamaria Helling. 8889;169(45): 2061-2068  (http://education.[facility].com/faq/FAQ164)    Total CHOL/HDL Ratio 4.0 <5.0 (calc)   Non-HDL Cholesterol (Calc) 105 <130 mg/dL (calc)    Comment: For patients with diabetes plus 1 major ASCVD risk  factor, treating to a non-HDL-C goal of <100 mg/dL  (LDL-C of <70 mg/dL) is considered a therapeutic  option.   COMPLETE METABOLIC PANEL WITH GFR     Status: None   Collection Time: 10/02/18  8:40 AM  Result Value Ref Range   Glucose, Bld 94 65 - 99 mg/dL    Comment: .            Fasting reference interval .    BUN 12 7 - 25 mg/dL   Creat 1.09 0.60 - 1.35 mg/dL   GFR, Est Non African American 92 > OR = 60 mL/min/1.26m   GFR, Est African American 106 > OR = 60 mL/min/1.735m  BUN/Creatinine Ratio NOT APPLICABLE 6 - 22 (calc)   Sodium 137 135 - 146 mmol/L   Potassium 4.2 3.5 - 5.3 mmol/L   Chloride 102 98 - 110 mmol/L   CO2 28 20 - 32 mmol/L   Calcium 9.4 8.6 - 10.3 mg/dL   Total Protein 7.1 6.1 - 8.1 g/dL   Albumin 4.4 3.6 - 5.1 g/dL   Globulin 2.7 1.9 - 3.7 g/dL (calc)   AG Ratio 1.6 1.0 - 2.5 (calc)   Total Bilirubin 0.5 0.2 - 1.2 mg/dL   Alkaline phosphatase (APISO) 61 40 - 115 U/L   AST 15 10 - 40 U/L   ALT 15 9 - 46 U/L  Hemoglobin A1c  Status: None   Collection Time: 10/02/18  8:40 AM  Result Value Ref Range   Hgb A1c MFr Bld 5.0 <5.7 % of total Hgb    Comment: For the purpose of screening for the presence of diabetes: . <5.7%       Consistent with the absence of diabetes 5.7-6.4%    Consistent with increased risk for diabetes             (prediabetes) > or =6.5%  Consistent with diabetes . This assay result is consistent with a decreased risk of diabetes. . Currently, no consensus exists regarding use of hemoglobin A1c for diagnosis of diabetes in children. . According to American Diabetes Association (ADA) guidelines,  hemoglobin A1c <7.0% represents optimal control in non-pregnant diabetic patients. Different metrics may apply to specific patient populations.  Standards of Medical Care in Diabetes(ADA). .    Mean Plasma Glucose 97 (calc)   eAG (mmol/L) 5.4 (calc)  Hepatic function panel     Status: None   Collection Time: 10/02/18  8:40 AM  Result Value Ref Range   Total Protein 7.1 6.1 - 8.1 g/dL   Albumin 4.4 3.6 - 5.1 g/dL   Globulin 2.7 1.9 - 3.7 g/dL (calc)   AG Ratio 1.6 1.0 - 2.5 (calc)   Total Bilirubin 0.5 0.2 - 1.2 mg/dL   Bilirubin, Direct 0.1 0.0 - 0.2 mg/dL   Indirect Bilirubin 0.4 0.2 - 1.2 mg/dL (calc)   Alkaline phosphatase (APISO) 61 40 - 115 U/L   AST 15 10 - 40 U/L   ALT 15 9 - 46 U/L  Sickle cell screen     Status: None   Collection Time: 10/02/18  8:40 AM  Result Value Ref Range   Sickle Solubility Test - HGBRFX NEGATIVE NEGATIVE    Comment: . Hemoglobin solubility testing alone is insufficient for detecting or confirming the presence of sickling hemoglobins in some situations. Additional testing may be required for diagnosis of hemoglobinopathies. For more information on this test go to: http://education.[facility].com/faq/FAQ99v1 .   B12 and Folate Panel     Status: None   Collection Time: 10/02/18  8:40 AM  Result Value Ref Range   Vitamin B-12 411 200 - 1,100 pg/mL   Folate 13.6 ng/mL    Comment:                            Reference Range                            Low:           <3.4                            Borderline:    3.4-5.4                            Normal:        >5.4 .   Surgical pathology     Status: None   Collection Time: 11/02/18 11:05 AM  Result Value Ref Range   SURGICAL PATHOLOGY      Surgical Pathology CASE: [id number] PATIENT: Annabelle Harman Surgical Pathology Report     SPECIMEN SUBMITTED: A. Colon polyp, ascendng; cold snare B. Colon polyp x2, sigmoid; cold snare(1), cxb(1)  CLINICAL HISTORY: None  provided  PRE-OPERATIVE DIAGNOSIS: Rectal bleeding  K62.5  POST-OPERATIVE DIAGNOSIS: Colon polyps     DIAGNOSIS: A. COLON POLYP, ASCENDING; BIOPSY: - POLYPOID BENIGN COLONIC MUCOSA WITH REACTIVE LYMPHOID AGGREGATE AND ASSOCIATED PROMINENT VASCULARITY IN LAMINA PROPRIA. - NEGATIVE FOR ADENOMA, HIGH-GRADE DYSPLASIA AND MALIGNANCY.  B. COLON POLYP X2 AND POLYP (SNARE); BIOPSY: - HYPERPLASTIC POLYPS (3). - NEGATIVE FOR ADENOMA, HIGH-GRADE DYSPLASIA AND MALIGNANCY.   GROSS DESCRIPTION: A. Labeled: Cold snare polyp ascending colon Received: In formalin Tissue fragment(s): 1 Size: 1.0 cm Description: Thin wispy tan fragment, marked blue at the base Entirely submitted in 1 cassette.  B. Labeled: Cbx polyp sigmoid colon x1, cold snar e polyp sigmoid colon x1 Received: In formalin Tissue fragment(s): 3 Size: 0.1-0.7 cm Description: Pink-tan fragments Entirely submitted in 1 cassette.   Final Diagnosis performed by Raynelle Bring, MD.   Electronically signed 11/03/2018 8:58:02AM The electronic signature indicates that the named Attending Pathologist has evaluated the specimen  Technical component performed at [facility], [address] [place], [place] [address] Lab: [phone] Dir: Rush Farmer, MD, MMM  Professional component performed at [facility], [facility], [place], [place], [place] [address] Lab: [phone] Dir: Dellia Nims. Rubinas, MD      PHQ2/9: Depression screen [facility] 2/9 11/11/2018 09/30/2018 09/25/2018  Decreased Interest 0 0 1  Down, Depressed, Hopeless 0 0 0  PHQ - 2 Score 0 0 1  Altered sleeping 1 0 1  Tired, decreased energy 0 1 2  Change in appetite 0 0 1  Feeling bad or failure about yourself  0 0 0  Trouble concentrating 0 0 0  Moving slowly or fidgety/restless 0 0 0  Suicidal thoughts 0 0 0  PHQ-9 Score 1 1 5   Difficult doing work/chores Not difficult at all - Somewhat difficult    Fall Risk: Fall Risk  11/11/2018  10/16/2018 10/07/2018 09/30/2018 09/25/2018  Falls in the past year? 0 0 0 No No  Follow up - - Falls evaluation completed - -     Functional Status Survey: Is the patient deaf or have difficulty hearing?: No Does the patient have difficulty seeing, even when wearing glasses/contacts?: No Does the patient have difficulty concentrating, remembering, or making decisions?: No Does the patient have difficulty walking or climbing stairs?: No Does the patient have difficulty dressing or bathing?: No Does the patient have difficulty doing errands alone such as visiting a doctor's office or shopping?: No    Assessment & Plan  1. Encounter for routine history and physical exam for male   2. Supraclavicular lymphadenopathy  Had [place], advised to have it removed if does not resolve  -Prostate cancer screening and PSA options (with potential risks and benefits of testing vs not testing) were discussed along with recent recs/guidelines. -USPSTF grade A and B recommendations reviewed with patient; age-appropriate recommendations, preventive care, screening tests, etc discussed and encouraged; healthy living encouraged; see AVS for patient education given to patient -Discussed importance of 150 minutes of physical activity weekly, eat two servings of fish weekly, eat one serving of tree nuts ( cashews, pistachios, pecans, almonds.Marland Kitchen) every other day, eat 6 servings of fruit/vegetables daily and drink plenty of water and avoid sweet beverages.

## 2018-11-12 ENCOUNTER — Ambulatory Visit: Payer: Self-pay | Admitting: Cardiovascular Disease

## 2018-11-19 ENCOUNTER — Ambulatory Visit
Admission: RE | Admit: 2018-11-19 | Discharge: 2018-11-19 | Disposition: A | Discharge status: Home / Self Care | Payer: BLUE CROSS/BLUE SHIELD | Source: Ambulatory Visit | Attending: Gastroenterology | Admitting: Gastroenterology

## 2018-11-19 DIAGNOSIS — K625 Hemorrhage of anus and rectum: Secondary | ICD-10-CM | POA: Insufficient documentation

## 2018-11-19 DIAGNOSIS — R933 Abnormal findings on diagnostic imaging of other parts of digestive tract: Secondary | ICD-10-CM | POA: Diagnosis not present

## 2018-11-19 MED ORDER — IOPAMIDOL (ISOVUE-300) INJECTION 61%
100.0000 mL | Freq: Once | INTRAVENOUS | Status: AC | PRN
  Administered 2018-11-19: 100 mL via INTRAVENOUS

## 2018-11-22 ENCOUNTER — Encounter: Payer: Self-pay | Admitting: Gastroenterology

## 2018-12-10 ENCOUNTER — Ambulatory Visit (INDEPENDENT_AMBULATORY_CARE_PROVIDER_SITE_OTHER): Payer: BLUE CROSS/BLUE SHIELD | Admitting: Gastroenterology

## 2018-12-10 ENCOUNTER — Encounter: Payer: Self-pay | Admitting: Gastroenterology

## 2018-12-10 VITALS — BP 136/85 | HR 76 | Ht 73.0 in | Wt 290.8 lb

## 2018-12-10 DIAGNOSIS — R933 Abnormal findings on diagnostic imaging of other parts of digestive tract: Secondary | ICD-10-CM | POA: Diagnosis not present

## 2018-12-10 NOTE — Progress Notes (Signed)
Jonathon Bellows MD, MRCP(U.K) [address]  [place], [place] [address]  Main: [phone]  Fax: [phone]   Primary Care Physician: Steele Sizer, MD  Primary Gastroenterologist:  Dr. Jonathon Bellows   Chief Complaint  Patient presents with  . Follow-up    Rectal Bleeding    HPI: Samuel Hendricks is a 28 y.o. male   Summary of history :  He is here today to follow up to his initial visit when he was seen in 10/2018 for rectal bleeding. Ongoing for about 5 years , on and off. Constipated at times.   Interval history   11//2019- 12/10/2018  Colonoscopy 11/02/18 :  Few diminutive polypds excised, internal hemoroids. Polypoid lesion at the appendicular orifice. Discussed with advanced GI at [facility] and they too felt likely inverted appendicular stump and didn't need resection.Followed up by a CT scan of the abdomen which was normal.polyps resected were hyperplastic.  No rectal bleeding , no constipation.     Current Outpatient Medications  Medication Sig Dispense Refill  . azelastine (ASTELIN) 0.1 % nasal spray Place 1 spray into both nostrils 2 (two) times daily. Use in each nostril as directed 30 mL 2  . famotidine (PEPCID) 40 MG tablet Take 1 tablet (40 mg total) by mouth daily. 30 tablet 2   No current facility-administered medications for this visit.     Allergies as of 12/10/2018  . (No Known Allergies)    ROS:  General: Negative for anorexia, weight loss, fever, chills, fatigue, weakness. ENT: Negative for hoarseness, difficulty swallowing , nasal congestion. CV: Negative for chest pain, angina, palpitations, dyspnea on exertion, peripheral edema.  Respiratory: Negative for dyspnea at rest, dyspnea on exertion, cough, sputum, wheezing.  GI: See history of present illness. GU:  Negative for dysuria, hematuria, urinary incontinence, urinary frequency, nocturnal urination.  Endo: Negative for unusual weight change.    Physical Examination:   BP  136/85   Pulse 76   Ht 6\' 1"  (1.854 m)   Wt 290 lb 12.8 oz (131.9 kg)   BMI 38.37 kg/m   General: Well-nourished, well-developed in no acute distress.  Eyes: No icterus. Conjunctivae pink. Mouth: Oropharyngeal mucosa moist and pink , no lesions erythema or exudate. Lungs: Clear to auscultation bilaterally. Non-labored. Heart: Regular rate and rhythm, no murmurs rubs or gallops.  Abdomen: Bowel sounds are normal, nontender, nondistended, no hepatosplenomegaly or masses, no abdominal bruits or hernia , no rebound or guarding.   Extremities: No lower extremity edema. No clubbing or deformities. Neuro: Alert and oriented x 3.  Grossly intact. Skin: Warm and dry, no jaundice.   Psych: Alert and cooperative, normal mood and affect.   Imaging Studies: Ct Abdomen Pelvis W Contrast  Result Date: 11/19/2018 CLINICAL DATA:  Abnormal appendiceal orifice on recent colonoscopy. EXAM: CT ABDOMEN AND PELVIS WITH CONTRAST TECHNIQUE: Multidetector CT imaging of the abdomen and pelvis was performed using the standard protocol following bolus administration of intravenous contrast. CONTRAST:  182mL ISOVUE-300 IOPAMIDOL (ISOVUE-300) INJECTION 61% COMPARISON:  None. FINDINGS: Lower chest: The lung bases are clear of acute process. No pleural effusion or pulmonary lesions. The heart is normal in size. No pericardial effusion. The distal esophagus and aorta are unremarkable. Hepatobiliary: No focal hepatic lesions or intrahepatic biliary dilatation. The gallbladder is normal. No common bile duct dilatation. Pancreas: No mass, inflammation or ductal dilatation. Spleen: Normal size.  No focal lesions. Adrenals/Urinary Tract: The adrenal glands and kidneys are normal. The bladder is normal. Stomach/Bowel: The  stomach, duodenum, small bowel and colon are unremarkable. No acute inflammatory process, mass lesions or obstructive findings. The terminal ileum is normal. I do not see an obvious abnormality involving the cecum.  No masses identified. Vascular/Lymphatic: The aorta is normal in caliber. No dissection. The branch vessels are patent. The major venous structures are patent. No mesenteric or retroperitoneal mass or adenopathy. Small scattered lymph nodes are noted. Reproductive: The prostate gland and seminal vesicles are unremarkable. Other: No pelvic mass or adenopathy. No free pelvic fluid collections. No inguinal mass or adenopathy. No abdominal wall hernia or subcutaneous lesions. Musculoskeletal: No significant bony findings. IMPRESSION: 1. No acute abdominal/pelvic findings, mass lesions or adenopathy. 2. Status post appendectomy. I do not see an obvious cecal lesion or pericecal lesion. Electronically Signed   By: Marijo Sanes M.D.   On: 11/19/2018 16:22    Assessment and Plan:   Samuel Hendricks is a 28 y.o. y/o male here to follow up for rectal bleeding and possible appendicular stump that was inverted seen on colonoscopy. Rectal bleeding has resolved.    Plan  1. High fiber diet  2. Repeat colonoscopy in 05/2019 to check for change in size of the lesion at the appendix and take bx.   I have discussed alternative options, risks & benefits,  which include, but are not limited to, bleeding, infection, perforation,respiratory complication & drug reaction.  The patient agrees with this plan & written consent will be obtained.    Dr Jonathon Bellows  MD,MRCP [facility]) Follow up PRN

## 2019-01-20 ENCOUNTER — Ambulatory Visit: Payer: Self-pay | Admitting: Cardiovascular Disease

## 2019-02-18 ENCOUNTER — Ambulatory Visit: Payer: BLUE CROSS/BLUE SHIELD | Admitting: Family Medicine

## 2019-02-18 ENCOUNTER — Other Ambulatory Visit: Payer: Self-pay

## 2019-02-18 ENCOUNTER — Encounter: Payer: Self-pay | Admitting: Family Medicine

## 2019-02-18 VITALS — BP 142/84 | HR 91 | Temp 97.9°F | Resp 16 | Ht 73.0 in | Wt 295.2 lb

## 2019-02-18 DIAGNOSIS — J069 Acute upper respiratory infection, unspecified: Secondary | ICD-10-CM | POA: Diagnosis not present

## 2019-02-18 DIAGNOSIS — R030 Elevated blood-pressure reading, without diagnosis of hypertension: Secondary | ICD-10-CM

## 2019-02-18 NOTE — Progress Notes (Signed)
Name: Samuel Hendricks   MRN: [id number]    DOB: 08/05/1991   Date:02/18/2019       Progress Note  Subjective  Chief Complaint  Chief Complaint  Patient presents with  . Nasal Congestion    Onset-week and half-sinus pressure and congestion has been taking Nyquil at night-Claritin and Vicks nasal spray  . Nausea    HPI  URI: he states symptoms started last week with rhinorrhea, nasal congestion, body aches, no cough or sore throat. However his girlfriend had similar symptoms and associated now with sob and she is very concerned about it. He states he is feeling better overall much better, but has a lot of post-nasal drainage. He was nauseated last week but it has resolved since.    Patient Active Problem List   Diagnosis Date Noted  . Goiter diffuse 10/06/2018  . Lymphadenopathy of left cervical region 09/30/2018  . Bright red rectal bleeding 09/30/2018    Past Surgical History:  Procedure Laterality Date  . APPENDECTOMY  12/02/1997  . COLONOSCOPY WITH PROPOFOL N/A 11/02/2018   Procedure: COLONOSCOPY WITH PROPOFOL;  Surgeon: Jonathon Bellows, MD;  Location: [facility] ENDOSCOPY;  Service: Gastroenterology;  Laterality: N/A;    Family History  Problem Relation Age of Onset  . Anemia Mother   . Diabetes Father   . Thyroid disease Sister   . Hypertension Maternal Grandmother   . Heart disease Paternal Grandmother   . Diabetes Paternal Grandfather   . Colon cancer Neg Hx     Social History   Socioeconomic History  . Marital status: Single    Spouse name: Not on file  . Number of children: 0  . Years of education: Not on file  . Highest education level: Bachelor's degree (e.g., BA, AB, BS)  Occupational History  . Occupation: Admission Counselor    Comment: [employer]  . Financial resource strain: Not hard at all  . Food insecurity:    Worry: Never true    Inability: Never true  . Transportation needs:    Medical: No    Non-medical: No  Tobacco Use  .  Smoking status: Never Smoker  . Smokeless tobacco: Never Used  Substance and Sexual Activity  . Alcohol use: Yes    Frequency: Never    Comment: rarely  . Drug use: Not Currently    Types: Marijuana    Comment: 7 months  . Sexual activity: Yes    Partners: Female    Birth control/protection: Condom  Lifestyle  . Physical activity:    Days per week: 1 day    Minutes per session: 40 min  . Stress: To some extent  Relationships  . Social connections:    Talks on phone: More than three times a week    Gets together: Three times a week    Attends religious service: More than 4 times per year    Active member of club or organization: Yes    Attends meetings of clubs or organizations: More than 4 times per year    Relationship status: Living with partner  . Intimate partner violence:    Fear of current or ex partner: No    Emotionally abused: No    Physically abused: No    Forced sexual activity: No  Other Topics Concern  . Not on file  Social History Narrative   Graduated from [employer] in [employer]   Currently working at [employer]      Current Outpatient Medications:  .  azelastine (ASTELIN) 0.1 % nasal spray, Place 1 spray into both nostrils 2 (two) times daily. Use in each nostril as directed, Disp: 30 mL, Rfl: 2 .  DM-APAP-CPM (VICKS NYQUIL COLD & FLU NIGHT PO), Take by mouth., Disp: , Rfl:  .  famotidine (PEPCID) 40 MG tablet, Take 1 tablet (40 mg total) by mouth daily., Disp: 30 tablet, Rfl: 2 .  loratadine (CLARITIN) 10 MG tablet, Take 10 mg by mouth daily., Disp: , Rfl:   No Known Allergies  I personally reviewed active problem list, medication list, allergies, family history, social history with the patient/caregiver today.   ROS  Ten systems reviewed and is negative except as mentioned in HPI   Objective  Vitals:   02/18/19 0947 02/18/19 1019  BP: (!) 152/98 (!) 142/84  Pulse: 91   Resp: 16   Temp: 97.9 F (36.6 C)   TempSrc: Oral   SpO2:  99%   Weight: 295 lb 3.2 oz (133.9 kg)   Height: 6\' 1"  (1.854 m)     Body mass index is 38.95 kg/m.  Physical Exam   Constitutional: Patient appears well-developed and well-nourished. Obese No distress.  HEENT: head atraumatic, normocephalic, pupils equal and reactive to light, ears normal TM,  neck supple, throat within normal limits Cardiovascular: Normal rate, regular rhythm and normal heart sounds.  No murmur heard. No BLE edema. Pulmonary/Chest: Effort normal and breath sounds normal. No respiratory distress. Abdominal: Soft.  There is no tenderness. Psychiatric: Patient has a normal mood and affect. behavior is normal. Judgment and thought content normal.  PHQ2/9: Depression screen [facility] 2/9 02/18/2019 11/11/2018 09/30/2018 09/25/2018  Decreased Interest 0 0 0 1  Down, Depressed, Hopeless 0 0 0 0  PHQ - 2 Score 0 0 0 1  Altered sleeping 0 1 0 1  Tired, decreased energy 2 0 1 2  Change in appetite 0 0 0 1  Feeling bad or failure about yourself  0 0 0 0  Trouble concentrating 0 0 0 0  Moving slowly or fidgety/restless 0 0 0 0  Suicidal thoughts 0 0 0 0  PHQ-9 Score 2 1 1 5   Difficult doing work/chores Not difficult at all Not difficult at all - Somewhat difficult    phq 9 negative  Fall Risk: Fall Risk  02/18/2019 11/11/2018 10/16/2018 10/07/2018 09/30/2018  Falls in the past year? 0 0 0 0 No  Number falls in past yr: 0 - - - -  Injury with Fall? 0 - - - -  Follow up - - - Falls evaluation completed -     Functional Status Survey: Is the patient deaf or have difficulty hearing?: No Does the patient have difficulty seeing, even when wearing glasses/contacts?: Yes Does the patient have difficulty concentrating, remembering, or making decisions?: No Does the patient have difficulty walking or climbing stairs?: No Does the patient have difficulty dressing or bathing?: No Does the patient have difficulty doing errands alone such as visiting a doctor's office or shopping?:  No    Assessment & Plan  1. Acute URI  Advised otc medication, fluids, rest   2. Elevated blood pressure reading  It may be secondary to otc cold medication, also stress from COVID-19

## 2019-02-19 ENCOUNTER — Ambulatory Visit: Payer: BLUE CROSS/BLUE SHIELD | Admitting: Family Medicine

## 2019-04-05 ENCOUNTER — Telehealth: Payer: Self-pay | Admitting: Gastroenterology

## 2019-04-05 NOTE — Telephone Encounter (Signed)
Pt is calling he received a my chart message to schedule a colonoscopy please call pt

## 2019-04-05 NOTE — Telephone Encounter (Signed)
Looks like he had an appt with Dr. Vicente Males in January and this was in his plan below. Looks like a pt reminder was sent out. See his colonoscopy report and office visit notes.    **Repeat colonoscopy in 05/2019 to check for change in size of the lesion at the appendix and take bx.**

## 2019-04-06 NOTE — Telephone Encounter (Signed)
I have not heard of nor have I spoken with this pt. I have no idea who called him.

## 2019-04-07 ENCOUNTER — Telehealth: Payer: Self-pay

## 2019-04-07 ENCOUNTER — Other Ambulatory Visit: Payer: Self-pay

## 2019-04-07 DIAGNOSIS — K388 Other specified diseases of appendix: Secondary | ICD-10-CM

## 2019-04-07 NOTE — Telephone Encounter (Signed)
Colonoscopy scheduled with Dr. Vicente Males for June 8th at [facility].  Thanks [employer]

## 2019-04-07 NOTE — Telephone Encounter (Signed)
Samuel Hendricks  June 2020/ diagnosis : abnormal appendix/mass

## 2019-04-28 ENCOUNTER — Encounter: Payer: Self-pay | Admitting: Family Medicine

## 2019-05-03 ENCOUNTER — Ambulatory Visit: Payer: BLUE CROSS/BLUE SHIELD | Admitting: Family Medicine

## 2019-05-03 ENCOUNTER — Other Ambulatory Visit: Payer: Self-pay

## 2019-05-03 ENCOUNTER — Encounter: Payer: Self-pay | Admitting: Family Medicine

## 2019-05-03 ENCOUNTER — Telehealth: Payer: Self-pay

## 2019-05-03 VITALS — BP 136/88 | HR 87 | Temp 98.0°F | Resp 16 | Ht 73.0 in | Wt 281.4 lb

## 2019-05-03 DIAGNOSIS — E049 Nontoxic goiter, unspecified: Secondary | ICD-10-CM

## 2019-05-03 DIAGNOSIS — K219 Gastro-esophageal reflux disease without esophagitis: Secondary | ICD-10-CM

## 2019-05-03 DIAGNOSIS — B081 Molluscum contagiosum: Secondary | ICD-10-CM

## 2019-05-03 DIAGNOSIS — E040 Nontoxic diffuse goiter: Secondary | ICD-10-CM

## 2019-05-03 DIAGNOSIS — E669 Obesity, unspecified: Secondary | ICD-10-CM

## 2019-05-03 NOTE — Telephone Encounter (Signed)
Patients procedure date has been changed due to limited availability with scheduling restrictions.  Patient has been rescheduled from 05/10/19 to 05/11/19.  Thanks [employer]

## 2019-05-03 NOTE — Progress Notes (Signed)
Name: Samuel Hendricks   MRN: [id number]    DOB: 08/28/1991   Date:05/03/2019       Progress Note  Subjective  Chief Complaint  Chief Complaint  Patient presents with  . Medication Refill    3 month F/U  . Other    Red Bumps on his private area and would like to be checked  . Elevated Blood Pressure    Since his last visit patient has changed his diet and exercising and checking BP twice a day runs around 122/78    HPI  Elevated bp: he has changed his diet, walking 5 miles daily and bp at home has been at goal, and also today is controlled, continue to monitor  Rash pubic area: going on for months, not itchy or irritated, he just noticed, he thought was ingrown but it looks different. Still same sexual partner, they live together. They don't use condoms, both of them have bene checked for STI. Constant since Feb   GERD: doing well , takes Pepcid prn at night, but doing better with life style modification  Patient Active Problem List   Diagnosis Date Noted  . Goiter diffuse 10/06/2018  . Lymphadenopathy of left cervical region 09/30/2018  . Bright red rectal bleeding 09/30/2018    Past Surgical History:  Procedure Laterality Date  . APPENDECTOMY  12/02/1997  . COLONOSCOPY WITH PROPOFOL N/A 11/02/2018   Procedure: COLONOSCOPY WITH PROPOFOL;  Surgeon: Jonathon Bellows, MD;  Location: [facility] ENDOSCOPY;  Service: Gastroenterology;  Laterality: N/A;    Family History  Problem Relation Age of Onset  . Anemia Mother   . Diabetes Father   . Thyroid disease Sister   . Hypertension Maternal Grandmother   . Heart disease Paternal Grandmother   . Diabetes Paternal Grandfather   . Colon cancer Neg Hx     Social History   Socioeconomic History  . Marital status: Single    Spouse name: Not on file  . Number of children: 0  . Years of education: Not on file  . Highest education level: Bachelor's degree (e.g., BA, AB, BS)  Occupational History  . Occupation: Admission Counselor     Comment: [employer]  . Financial resource strain: Not hard at all  . Food insecurity:    Worry: Never true    Inability: Never true  . Transportation needs:    Medical: No    Non-medical: No  Tobacco Use  . Smoking status: Never Smoker  . Smokeless tobacco: Never Used  Substance and Sexual Activity  . Alcohol use: Yes    Frequency: Never    Comment: rarely  . Drug use: Not Currently    Types: Marijuana    Comment: 7 months  . Sexual activity: Yes    Partners: Female    Birth control/protection: Condom  Lifestyle  . Physical activity:    Days per week: 1 day    Minutes per session: 40 min  . Stress: To some extent  Relationships  . Social connections:    Talks on phone: More than three times a week    Gets together: Three times a week    Attends religious service: More than 4 times per year    Active member of club or organization: Yes    Attends meetings of clubs or organizations: More than 4 times per year    Relationship status: Living with partner  . Intimate partner violence:    Fear of current or ex partner:  No    Emotionally abused: No    Physically abused: No    Forced sexual activity: No  Other Topics Concern  . Not on file  Social History Narrative   Graduated from [employer] in [employer]   Currently working at [employer]      Current Outpatient Medications:  .  famotidine (PEPCID) 40 MG tablet, Take 1 tablet (40 mg total) by mouth daily., Disp: 30 tablet, Rfl: 2 .  loratadine (CLARITIN) 10 MG tablet, Take 10 mg by mouth daily., Disp: , Rfl:   No Known Allergies  I personally reviewed active problem list, family and social history  with the patient/caregiver today.   ROS  Constitutional: Negative for fever or weight change.  Respiratory: Negative for cough and shortness of breath.   Cardiovascular: Negative for chest pain or palpitations.  Gastrointestinal: Negative for abdominal pain, no bowel changes.   Musculoskeletal: Negative for gait problem or joint swelling.  Skin: Positive  for rash.  Neurological: Negative for dizziness or headache.  No other specific complaints in a complete review of systems (except as listed in HPI above).  Objective  Vitals:   05/03/19 1024  BP: 136/88  Pulse: 87  Resp: 16  Temp: 98 F (36.7 C)  TempSrc: Oral  SpO2: 99%  Weight: 281 lb 6.4 oz (127.6 kg)  Height: 6\' 1"  (1.854 m)    Body mass index is 37.13 kg/m.  Physical Exam  Constitutional: Patient appears well-developed and well-nourished. Obese  No distress.  HEENT: head atraumatic, normocephalic, pupils equal and reactive to light,  neck supple, boggy turbinates, oral mucosa moist  Cardiovascular: Normal rate, regular rhythm and normal heart sounds.  No murmur heard. No BLE edema. Pulmonary/Chest: Effort normal and breath sounds normal. No respiratory distress. Abdominal: Soft.  There is no tenderness. Psychiatric: Patient has a normal mood and affect. behavior is normal. Judgment and thought content normal.  PHQ2/9: Depression screen [facility] 2/9 05/03/2019 02/18/2019 11/11/2018 09/30/2018 09/25/2018  Decreased Interest 0 0 0 0 1  Down, Depressed, Hopeless 0 0 0 0 0  PHQ - 2 Score 0 0 0 0 1  Altered sleeping 0 0 1 0 1  Tired, decreased energy 0 2 0 1 2  Change in appetite 0 0 0 0 1  Feeling bad or failure about yourself  0 0 0 0 0  Trouble concentrating 0 0 0 0 0  Moving slowly or fidgety/restless 0 0 0 0 0  Suicidal thoughts 0 0 0 0 0  PHQ-9 Score 0 2 1 1 5   Difficult doing work/chores Not difficult at all Not difficult at all Not difficult at all - Somewhat difficult    phq 9 is negative   Fall Risk: Fall Risk  05/03/2019 02/18/2019 11/11/2018 10/16/2018 10/07/2018  Falls in the past year? 0 0 0 0 0  Number falls in past yr: 0 0 - - -  Injury with Fall? 0 0 - - -  Follow up - - - - Falls evaluation completed     Functional Status Survey: Is the patient deaf or have difficulty  hearing?: No Does the patient have difficulty seeing, even when wearing glasses/contacts?: Yes Does the patient have difficulty concentrating, remembering, or making decisions?: No Does the patient have difficulty walking or climbing stairs?: No Does the patient have difficulty dressing or bathing?: No Does the patient have difficulty doing errands alone such as visiting a doctor's office or shopping?: No    Assessment & Plan  1. GERD without esophagitis  Continue pepcid prn  2. Obesity (BMI 35.0-39.9 without comorbidity)  Discussed with the patient the risk posed by an increased BMI. Discussed importance of portion control, calorie counting and at least 150 minutes of physical activity weekly. Avoid sweet beverages and drink more water. Eat at least 6 servings of fruit and vegetables daily   3. Goiter diffuse  Stable and normal thyroid function test  4. Mollusca contagiosa  Discussed with patient

## 2019-05-03 NOTE — Patient Instructions (Signed)
Molluscum Contagiosum, Adult Molluscum contagiosum is a skin infection that can cause a rash. Your rash may go away on its own, or you may need to have a procedure or use medicine to treat the rash. What are the causes? This condition is caused by a virus. The virus can spread from person to person (is contagious). It can spread through:  Skin-to-skin contact with an infected person.  Contact with an object that has the virus on it (contaminated object), such as a towel or clothing.  Sexual activity. What increases the risk? You may be more likely to develop this condition if you:  Live in an area where the weather is moist and warm.  Have a weak disease-fighting system (immune system). What are the signs or symptoms? The main symptom of this condition is a painless rash that appears 2-7 weeks after exposure to the virus. The rash is made up of small, dome-shaped bumps on the skin. The bumps may:  Affect the genitals, thighs, face, neck, or abdomen.  Be pink or flesh-colored.  Appear one by one or in groups.  Range from the size of a pinhead to the size of a pencil eraser.  Feel firm, smooth, and waxy.  Have a pit in the middle.  Itch. For most people, the rash does not itch. How is this diagnosed? This condition may be diagnosed based on:  Your symptoms and medical history.  A physical exam.  Scraping the bumps to collect a skin sample for testing. How is this treated? The rash usually goes away within 2 months, but it can sometimes take 6-12 months for it to clear completely. For some people, the rash may go away on its own, without treatment. In some cases, treatment may be needed to keep the virus from infecting other people or to keep the rash from spreading to other parts of your body. Treatment may also be done if you have anxiety or stress because of the way the rash looks. If you do need treatment, the options may include:  Surgery to remove the bumps by freezing  them (cryosurgery).  A procedure to scrape off the bumps (curettage).  A procedure to remove the bumps with a laser.  Putting medicine on the bumps (topical treatment). Follow these instructions at home: General instructions  Take or apply over-the-counter and prescription medicines only as told by your health care provider.  Do not scratch or pick at the bumps. Scratching or picking can cause the rash to spread to other parts of your body. Preventing infection As long as you have bumps on your skin, the infection can spread to other people. To prevent this from happening:  Do not share clothing or towels with others until the bumps go away.  Do not use a public swimming pool, sauna, or shower until the bumps go away.  Avoid close contact with others until the bumps go away. This includes sexual contact.  Wash your hands often with soap and water. If soap and water are not available, use hand sanitizer.  Cover the bumps with clothing or a bandage when you will be near other people. Contact a health care provider if:  The bumps are spreading.  The bumps are becoming red and sore.  The bumps have not gone away after 12 months. Summary  Molluscum contagiosum is a skin infection that can cause a rash made up of small, dome-shaped bumps.  The infection is caused by a virus.  The rash usually goes away  within 2 months, but it can sometimes take 6-12 months for it to clear completely.  The rash often goes away on its own. However, treatment is sometimes recommended to keep the virus from infecting other people or to keep it from spreading to other parts of your body. This information is not intended to replace advice given to you by your health care provider. Make sure you discuss any questions you have with your health care provider. Document Released: 06/15/2014 Document Revised: 12/01/2017 Document Reviewed: 12/01/2017 Elsevier Interactive Patient Education  2019 [employer].

## 2019-05-05 ENCOUNTER — Other Ambulatory Visit: Payer: BLUE CROSS/BLUE SHIELD

## 2019-05-07 ENCOUNTER — Other Ambulatory Visit: Payer: Self-pay

## 2019-05-07 ENCOUNTER — Other Ambulatory Visit
Admission: RE | Admit: 2019-05-07 | Discharge: 2019-05-07 | Disposition: A | Discharge status: Home / Self Care | Payer: BC Managed Care – PPO | Source: Ambulatory Visit | Attending: Gastroenterology | Admitting: Gastroenterology

## 2019-05-07 DIAGNOSIS — Z1159 Encounter for screening for other viral diseases: Secondary | ICD-10-CM | POA: Insufficient documentation

## 2019-05-07 DIAGNOSIS — Z01812 Encounter for preprocedural laboratory examination: Secondary | ICD-10-CM | POA: Diagnosis not present

## 2019-05-08 LAB — NOVEL CORONAVIRUS, NAA (HOSP ORDER, SEND-OUT TO REF LAB; TAT 18-24 HRS): SARS-CoV-2, NAA: NOT DETECTED

## 2019-05-11 ENCOUNTER — Encounter: Payer: Self-pay | Admitting: *Deleted

## 2019-05-11 ENCOUNTER — Ambulatory Visit: Payer: BC Managed Care – PPO | Admitting: Anesthesiology

## 2019-05-11 ENCOUNTER — Other Ambulatory Visit: Payer: Self-pay

## 2019-05-11 ENCOUNTER — Encounter
Admission: RE | Disposition: A | Discharge status: Home / Self Care | Payer: Self-pay | Source: Home / Self Care | Attending: Gastroenterology

## 2019-05-11 ENCOUNTER — Ambulatory Visit
Admission: RE | Admit: 2019-05-11 | Discharge: 2019-05-11 | Disposition: A | Discharge status: Home / Self Care | Payer: BC Managed Care – PPO | Attending: Gastroenterology | Admitting: Gastroenterology

## 2019-05-11 DIAGNOSIS — D121 Benign neoplasm of appendix: Secondary | ICD-10-CM | POA: Diagnosis not present

## 2019-05-11 DIAGNOSIS — J302 Other seasonal allergic rhinitis: Secondary | ICD-10-CM | POA: Insufficient documentation

## 2019-05-11 DIAGNOSIS — K219 Gastro-esophageal reflux disease without esophagitis: Secondary | ICD-10-CM | POA: Diagnosis not present

## 2019-05-11 DIAGNOSIS — K388 Other specified diseases of appendix: Secondary | ICD-10-CM | POA: Diagnosis not present

## 2019-05-11 DIAGNOSIS — Z09 Encounter for follow-up examination after completed treatment for conditions other than malignant neoplasm: Secondary | ICD-10-CM | POA: Insufficient documentation

## 2019-05-11 DIAGNOSIS — K640 First degree hemorrhoids: Secondary | ICD-10-CM | POA: Diagnosis not present

## 2019-05-11 DIAGNOSIS — Z79899 Other long term (current) drug therapy: Secondary | ICD-10-CM | POA: Insufficient documentation

## 2019-05-11 DIAGNOSIS — Z86010 Personal history of colonic polyps: Secondary | ICD-10-CM | POA: Insufficient documentation

## 2019-05-11 DIAGNOSIS — K635 Polyp of colon: Secondary | ICD-10-CM | POA: Diagnosis not present

## 2019-05-11 HISTORY — PX: COLONOSCOPY WITH PROPOFOL: SHX5780

## 2019-05-11 SURGERY — COLONOSCOPY WITH PROPOFOL
Anesthesia: General

## 2019-05-11 MED ORDER — LIDOCAINE HCL (CARDIAC) PF 100 MG/5ML IV SOSY
PREFILLED_SYRINGE | INTRAVENOUS | Status: DC | PRN
  Administered 2019-05-11: 50 mg via INTRAVENOUS

## 2019-05-11 MED ORDER — MIDAZOLAM HCL 2 MG/2ML IJ SOLN
INTRAMUSCULAR | Status: AC
  Filled 2019-05-11: qty 2

## 2019-05-11 MED ORDER — SODIUM CHLORIDE 0.9 % IV SOLN
INTRAVENOUS | Status: DC
  Administered 2019-05-11 (×2): via INTRAVENOUS

## 2019-05-11 MED ORDER — MIDAZOLAM HCL 2 MG/2ML IJ SOLN
INTRAMUSCULAR | Status: DC | PRN
  Administered 2019-05-11: 2 mg via INTRAVENOUS

## 2019-05-11 MED ORDER — PROPOFOL 10 MG/ML IV BOLUS
INTRAVENOUS | Status: DC | PRN
  Administered 2019-05-11: 20 mg via INTRAVENOUS
  Administered 2019-05-11: 10 mg via INTRAVENOUS
  Administered 2019-05-11: 20 mg via INTRAVENOUS
  Administered 2019-05-11: 30 mg via INTRAVENOUS
  Administered 2019-05-11: 20 mg via INTRAVENOUS

## 2019-05-11 MED ORDER — PROPOFOL 500 MG/50ML IV EMUL
INTRAVENOUS | Status: DC | PRN
  Administered 2019-05-11: 100 ug/kg/min via INTRAVENOUS

## 2019-05-11 NOTE — Transfer of Care (Signed)
Immediate Anesthesia Transfer of Care Note  Patient: Samuel Hendricks  Procedure(s) Performed: COLONOSCOPY WITH PROPOFOL (N/A )  Patient Location: PACU  Anesthesia Type:General  Level of Consciousness: awake  Airway & Oxygen Therapy: Patient Spontanous Breathing  Post-op Assessment: Report given to RN  Post vital signs: stable  Last Vitals:  Vitals Value Taken Time  BP 114/64 05/11/2019 10:28 AM  Temp 37.2 C 05/11/2019 10:20 AM  Pulse 90 05/11/2019 10:30 AM  Resp 12 05/11/2019 10:30 AM  SpO2 100 % 05/11/2019 10:30 AM  Vitals shown include unvalidated device data.  Last Pain:  Vitals:   05/11/19 1020  TempSrc: Tympanic         Complications: No apparent anesthesia complications

## 2019-05-11 NOTE — Op Note (Signed)
[facility]astroenterology Patient Name: Samuel Hendricks Procedure Date: 05/11/2019 9:58 AM MRN: [id number] Account #: [id number] Date of Birth: [birth year] Admit Type: Outpatient Age: 28 Room: [facility] ENDO ROOM 3 Gender: Male Note Status: Finalized Procedure:            Colonoscopy Indications:          High risk colon cancer surveillance: Personal history                        of colonic polyps Providers:            Jonathon Bellows MD, MD Referring MD:         Bethena Roys. Sowles, MD (Referring MD) Medicines:            Monitored Anesthesia Care Complications:        No immediate complications. Procedure:            Pre-Anesthesia Assessment:                       - Prior to the procedure, a History and Physical was                        performed, and patient medications, allergies and                        sensitivities were reviewed. The patient's tolerance of                        previous anesthesia was reviewed.                       - The risks and benefits of the procedure and the                        sedation options and risks were discussed with the                        patient. All questions were answered and informed                        consent was obtained.                       - ASA Grade Assessment: II - A patient with mild                        systemic disease.                       After obtaining informed consent, the colonoscope was                        passed under direct vision. Throughout the procedure,                        the patient's blood pressure, pulse, and oxygen                        saturations were monitored continuously. The  Colonoscope was introduced through the anus and                        advanced to the the cecum, identified by the                        appendiceal orifice, IC valve and transillumination.                        The colonoscopy was performed with ease. The patient                  tolerated the procedure well. The quality of the bowel                        preparation was good. Findings:      The perianal and digital rectal examinations were normal.      A polypoid medium-sized mass was found at the appendiceal orifice. The       mass was non-circumferential. The mass measured twelve cm in length. No       bleeding was present. This was biopsied with a cold forceps for       histology.      Non-bleeding internal hemorrhoids were found during retroflexion. The       hemorrhoids were Grade I (internal hemorrhoids that do not prolapse).      The exam was otherwise without abnormality on direct and retroflexion       views. Impression:           - Likely benign tumor at the appendiceal orifice.                        Biopsied.                       - Non-bleeding internal hemorrhoids.                       - The examination was otherwise normal on direct and                        retroflexion views. Recommendation:       - Discharge patient to home (with escort).                       - Resume previous diet.                       - Continue present medications.                       - Await pathology results.                       - Repeat colonoscopy for surveillance based on                        pathology results. Procedure Code(s):    --- Professional ---                       [phone], Colonoscopy, flexible; with biopsy, single or  multiple Diagnosis Code(s):    --- Professional ---                       Z86.010, Personal history of colonic polyps                       D49.0, Neoplasm of unspecified behavior of digestive                        system                       K64.0, First degree hemorrhoids CPT copyright 2019 American Medical Association. All rights reserved. The codes documented in this report are preliminary and upon coder review may  be revised to meet current compliance requirements. Jonathon Bellows, MD Jonathon Bellows MD, MD 05/11/2019 10:25:17 AM This report has been signed electronically. Number of Addenda: 0 Note Initiated On: 05/11/2019 9:58 AM Scope Withdrawal Time: 0 hours 9 minutes 19 seconds  Total Procedure Duration: 0 hours 11 minutes 3 seconds  Estimated Blood Loss: Estimated blood loss: none.      [facility]

## 2019-05-11 NOTE — Anesthesia Post-op Follow-up Note (Signed)
Anesthesia QCDR form completed.        

## 2019-05-11 NOTE — H&P (Signed)
Samuel Bellows, MD [address] [place], [place], [place], [address] [address], [place], [place], [place], [address] Phone: [phone]  Fax: [phone]  Primary Care Physician:  Steele Sizer, MD   Pre-Procedure History & Physical: HPI:  Samuel Hendricks is a 28 y.o. male is here for an colonoscopy.   Past Medical History:  Diagnosis Date  . Retina hole, right    Congential  . Seasonal allergies     Past Surgical History:  Procedure Laterality Date  . APPENDECTOMY  12/02/1997  . COLONOSCOPY WITH PROPOFOL N/A 11/02/2018   Procedure: COLONOSCOPY WITH PROPOFOL;  Surgeon: Samuel Bellows, MD;  Location: [facility] ENDOSCOPY;  Service: Gastroenterology;  Laterality: N/A;    Prior to Admission medications   Medication Sig Start Date End Date Taking? Authorizing Provider  famotidine (PEPCID) 40 MG tablet Take 1 tablet (40 mg total) by mouth daily. 10/16/18 10/16/19 Yes Sowles, Drue Stager, MD  loratadine (CLARITIN) 10 MG tablet Take 10 mg by mouth daily.   Yes [provider]    Allergies as of 04/07/2019  . (No Known Allergies)    Family History  Problem Relation Age of Onset  . Anemia Mother   . Diabetes Father   . Thyroid disease Sister   . Hypertension Maternal Grandmother   . Heart disease Paternal Grandmother   . Diabetes Paternal Grandfather   . Colon cancer Neg Hx     Social History   Socioeconomic History  . Marital status: Single    Spouse name: Not on file  . Number of children: 0  . Years of education: Not on file  . Highest education level: Bachelor's degree (e.g., BA, AB, BS)  Occupational History  . Occupation: Admission Counselor    Comment: [employer]  . Financial resource strain: Not hard at all  . Food insecurity:    Worry: Never true    Inability: Never true  . Transportation needs:    Medical: No    Non-medical: No  Tobacco Use  . Smoking status: Never Smoker  . Smokeless tobacco: Never Used  Substance and  Sexual Activity  . Alcohol use: Yes    Frequency: Never    Comment: rarely  . Drug use: Not Currently    Types: Marijuana    Comment: 7 months  . Sexual activity: Yes    Partners: Female    Birth control/protection: Condom  Lifestyle  . Physical activity:    Days per week: 1 day    Minutes per session: 40 min  . Stress: To some extent  Relationships  . Social connections:    Talks on phone: More than three times a week    Gets together: Three times a week    Attends religious service: More than 4 times per year    Active member of club or organization: Yes    Attends meetings of clubs or organizations: More than 4 times per year    Relationship status: Living with partner  . Intimate partner violence:    Fear of current or ex partner: No    Emotionally abused: No    Physically abused: No    Forced sexual activity: No  Other Topics Concern  . Not on file  Social History Narrative   Graduated from [employer] in [employer]   Currently working at [employer]: See HPI, otherwise negative ROS  Physical Exam: BP (!) 142/96   Pulse 94  Temp 99 F (37.2 C) (Tympanic)   Resp 18   Ht 6\' 1"  (1.854 m)   Wt 127.5 kg   SpO2 100%   BMI 37.07 kg/m  General:   Alert,  pleasant and cooperative in NAD Head:  Normocephalic and atraumatic. Neck:  Supple; no masses or thyromegaly. Lungs:  Clear throughout to auscultation, normal respiratory effort.    Heart:  +S1, +S2, Regular rate and rhythm, No edema. Abdomen:  Soft, nontender and nondistended. Normal bowel sounds, without guarding, and without rebound.   Neurologic:  Alert and  oriented x4;  grossly normal neurologically.  Impression/Plan: Samuel Hendricks is here for an colonoscopy to be performed for surveillance due to prior history of colon polyps   Risks, benefits, limitations, and alternatives regarding  colonoscopy have been reviewed with the patient.  Questions have been answered.  All  parties agreeable.   Samuel Bellows, MD  05/11/2019, 9:57 AM

## 2019-05-11 NOTE — Anesthesia Preprocedure Evaluation (Signed)
Anesthesia Evaluation  Patient identified by MRN, date of birth, ID band Patient awake    Reviewed: Allergy & Precautions, H&P , NPO status , Patient's Chart, lab work & pertinent test results  Airway Mallampati: III       Dental  (+) Teeth Intact, Caps, Dental Advidsory Given   Pulmonary neg pulmonary ROS, neg COPD,           Cardiovascular (-) angina+ dysrhythmias (occasional palpitations)      Neuro/Psych negative neurological ROS  negative psych ROS   GI/Hepatic Neg liver ROS, GERD  ,  Endo/Other  negative endocrine ROS  Renal/GU negative Renal ROS  negative genitourinary   Musculoskeletal   Abdominal   Peds  Hematology negative hematology ROS (+)   Anesthesia Other Findings Past Medical History: No date: Retina hole, right     Comment:  Congential No date: Seasonal allergies  Past Surgical History: 12/02/1997: APPENDECTOMY  BMI    Body Mass Index:  36.94 kg/m      Reproductive/Obstetrics negative OB ROS                             Anesthesia Physical  Anesthesia Plan  ASA: I  Anesthesia Plan: General   Post-op Pain Management:    Induction: Intravenous  PONV Risk Score and Plan: Propofol infusion and TIVA  Airway Management Planned: Natural Airway and Nasal Cannula  Additional Equipment:   Intra-op Plan:   Post-operative Plan:   Informed Consent: I have reviewed the patients History and Physical, chart, labs and discussed the procedure including the risks, benefits and alternatives for the proposed anesthesia with the patient or authorized representative who has indicated his/her understanding and acceptance.     Dental Advisory Given  Plan Discussed with: Anesthesiologist  Anesthesia Plan Comments:         Anesthesia Quick Evaluation

## 2019-05-11 NOTE — Anesthesia Postprocedure Evaluation (Signed)
Anesthesia Post Note  Patient: Samuel Hendricks  Procedure(s) Performed: COLONOSCOPY WITH PROPOFOL (N/A )  Patient location during evaluation: Endoscopy Anesthesia Type: General Level of consciousness: awake and alert Pain management: pain level controlled Vital Signs Assessment: post-procedure vital signs reviewed and stable Respiratory status: spontaneous breathing, nonlabored ventilation, respiratory function stable and patient connected to nasal cannula oxygen Cardiovascular status: blood pressure returned to baseline and stable Postop Assessment: no apparent nausea or vomiting Anesthetic complications: no     Last Vitals:  Vitals:   05/11/19 1030 05/11/19 1040  BP: 114/64 106/81  Pulse: 90 91  Resp: 12 11  Temp:    SpO2: 100% 100%    Last Pain:  Vitals:   05/11/19 1020  TempSrc: Tympanic                 Martha Clan

## 2019-05-12 ENCOUNTER — Encounter: Payer: Self-pay | Admitting: Gastroenterology

## 2019-05-12 LAB — SURGICAL PATHOLOGY

## 2019-05-13 ENCOUNTER — Telehealth: Payer: Self-pay

## 2019-05-13 NOTE — Telephone Encounter (Signed)
Spoke with pt and informed him of biopsy results. 

## 2019-05-13 NOTE — Telephone Encounter (Signed)
-----   Message from Jonathon Bellows, MD sent at 05/12/2019  6:31 PM EDT ----- Sherald Hess inform the biopsies are completely benign

## 2019-05-18 ENCOUNTER — Encounter: Payer: Self-pay | Admitting: Family Medicine

## 2019-05-23 DIAGNOSIS — Z1159 Encounter for screening for other viral diseases: Secondary | ICD-10-CM | POA: Diagnosis not present

## 2019-06-28 ENCOUNTER — Ambulatory Visit: Payer: BLUE CROSS/BLUE SHIELD | Admitting: Family Medicine

## 2019-06-28 ENCOUNTER — Other Ambulatory Visit: Payer: Self-pay

## 2019-06-28 ENCOUNTER — Encounter: Payer: Self-pay | Admitting: Family Medicine

## 2019-06-28 VITALS — BP 138/82 | HR 88 | Temp 97.1°F | Resp 14 | Ht 72.0 in | Wt 284.2 lb

## 2019-06-28 DIAGNOSIS — S8391XA Sprain of unspecified site of right knee, initial encounter: Secondary | ICD-10-CM

## 2019-06-28 MED ORDER — MELOXICAM 15 MG PO TABS: 15.0000 mg | ORAL_TABLET | Freq: Every day | ORAL | 0 refills | Status: DC

## 2019-06-28 NOTE — Patient Instructions (Signed)
Journal for Nurse Practitioners, 15(4), 263-267. Retrieved September 07, 2018 from http://clinicalkey.com/nursing">  Knee Exercises Ask your health care provider which exercises are safe for you. Do exercises exactly as told by your health care provider and adjust them as directed. It is normal to feel mild stretching, pulling, tightness, or discomfort as you do these exercises. Stop right away if you feel sudden pain or your pain gets worse. Do not begin these exercises until told by your health care provider. Stretching and range-of-motion exercises These exercises warm up your muscles and joints and improve the movement and flexibility of your knee. These exercises also help to relieve pain and swelling. Knee extension, prone 1. Lie on your abdomen (prone position) on a bed. 2. Place your left / right knee just beyond the edge of the surface so your knee is not on the bed. You can put a towel under your left / right thigh just above your kneecap for comfort. 3. Relax your leg muscles and allow gravity to straighten your knee (extension). You should feel a stretch behind your left / right knee. 4. Hold this position for __________ seconds. 5. Scoot up so your knee is supported between repetitions. Repeat __________ times. Complete this exercise __________ times a day. Knee flexion, active  1. Lie on your back with both legs straight. If this causes back discomfort, bend your left / right knee so your foot is flat on the floor. 2. Slowly slide your left / right heel back toward your buttocks. Stop when you feel a gentle stretch in the front of your knee or thigh (flexion). 3. Hold this position for __________ seconds. 4. Slowly slide your left / right heel back to the starting position. Repeat __________ times. Complete this exercise __________ times a day. Quadriceps stretch, prone  1. Lie on your abdomen on a firm surface, such as a bed or padded floor. 2. Bend your left / right knee and hold  your ankle. If you cannot reach your ankle or pant leg, loop a belt around your foot and grab the belt instead. 3. Gently pull your heel toward your buttocks. Your knee should not slide out to the side. You should feel a stretch in the front of your thigh and knee (quadriceps). 4. Hold this position for __________ seconds. Repeat __________ times. Complete this exercise __________ times a day. Hamstring, supine 1. Lie on your back (supine position). 2. Loop a belt or towel over the ball of your left / right foot. The ball of your foot is on the walking surface, right under your toes. 3. Straighten your left / right knee and slowly pull on the belt to raise your leg until you feel a gentle stretch behind your knee (hamstring). ? Do not let your knee bend while you do this. ? Keep your other leg flat on the floor. 4. Hold this position for __________ seconds. Repeat __________ times. Complete this exercise __________ times a day. Strengthening exercises These exercises build strength and endurance in your knee. Endurance is the ability to use your muscles for a long time, even after they get tired. Quadriceps, isometric This exercise stretches the muscles in front of your thigh (quadriceps) without moving your knee joint (isometric). 1. Lie on your back with your left / right leg extended and your other knee bent. Put a rolled towel or small pillow under your knee if told by your health care provider. 2. Slowly tense the muscles in the front of your left /   right thigh. You should see your kneecap slide up toward your hip or see increased dimpling just above the knee. This motion will push the back of the knee toward the floor. 3. For __________ seconds, hold the muscle as tight as you can without increasing your pain. 4. Relax the muscles slowly and completely. Repeat __________ times. Complete this exercise __________ times a day. Straight leg raises This exercise stretches the muscles in front  of your thigh (quadriceps) and the muscles that move your hips (hip flexors). 1. Lie on your back with your left / right leg extended and your other knee bent. 2. Tense the muscles in the front of your left / right thigh. You should see your kneecap slide up or see increased dimpling just above the knee. Your thigh may even shake a bit. 3. Keep these muscles tight as you raise your leg 4-6 inches (10-15 cm) off the floor. Do not let your knee bend. 4. Hold this position for __________ seconds. 5. Keep these muscles tense as you lower your leg. 6. Relax your muscles slowly and completely after each repetition. Repeat __________ times. Complete this exercise __________ times a day. Hamstring, isometric 1. Lie on your back on a firm surface. 2. Bend your left / right knee about __________ degrees. 3. Dig your left / right heel into the surface as if you are trying to pull it toward your buttocks. Tighten the muscles in the back of your thighs (hamstring) to "dig" as hard as you can without increasing any pain. 4. Hold this position for __________ seconds. 5. Release the tension gradually and allow your muscles to relax completely for __________ seconds after each repetition. Repeat __________ times. Complete this exercise __________ times a day. Hamstring curls If told by your health care provider, do this exercise while wearing ankle weights. Begin with __________ lb weights. Then increase the weight by 1 lb (0.5 kg) increments. Do not wear ankle weights that are more than __________ lb. 1. Lie on your abdomen with your legs straight. 2. Bend your left / right knee as far as you can without feeling pain. Keep your hips flat against the floor. 3. Hold this position for __________ seconds. 4. Slowly lower your leg to the starting position. Repeat __________ times. Complete this exercise __________ times a day. Squats This exercise strengthens the muscles in front of your thigh and knee  (quadriceps). 1. Stand in front of a table, with your feet and knees pointing straight ahead. You may rest your hands on the table for balance but not for support. 2. Slowly bend your knees and lower your hips like you are going to sit in a chair. ? Keep your weight over your heels, not over your toes. ? Keep your lower legs upright so they are parallel with the table legs. ? Do not let your hips go lower than your knees. ? Do not bend lower than told by your health care provider. ? If your knee pain increases, do not bend as low. 3. Hold the squat position for __________ seconds. 4. Slowly push with your legs to return to standing. Do not use your hands to pull yourself to standing. Repeat __________ times. Complete this exercise __________ times a day. Wall slides This exercise strengthens the muscles in front of your thigh and knee (quadriceps). 1. Lean your back against a smooth wall or door, and walk your feet out 18-24 inches (46-61 cm) from it. 2. Place your feet hip-width apart. 3.   Slowly slide down the wall or door until your knees bend __________ degrees. Keep your knees over your heels, not over your toes. Keep your knees in line with your hips. 4. Hold this position for __________ seconds. Repeat __________ times. Complete this exercise __________ times a day. Straight leg raises This exercise strengthens the muscles that rotate the leg at the hip and move it away from your body (hip abductors). 1. Lie on your side with your left / right leg in the top position. Lie so your head, shoulder, knee, and hip line up. You may bend your bottom knee to help you keep your balance. 2. Roll your hips slightly forward so your hips are stacked directly over each other and your left / right knee is facing forward. 3. Leading with your heel, lift your top leg 4-6 inches (10-15 cm). You should feel the muscles in your outer hip lifting. ? Do not let your foot drift forward. ? Do not let your knee  roll toward the ceiling. 4. Hold this position for __________ seconds. 5. Slowly return your leg to the starting position. 6. Let your muscles relax completely after each repetition. Repeat __________ times. Complete this exercise __________ times a day. Straight leg raises This exercise stretches the muscles that move your hips away from the front of the pelvis (hip extensors). 1. Lie on your abdomen on a firm surface. You can put a pillow under your hips if that is more comfortable. 2. Tense the muscles in your buttocks and lift your left / right leg about 4-6 inches (10-15 cm). Keep your knee straight as you lift your leg. 3. Hold this position for __________ seconds. 4. Slowly lower your leg to the starting position. 5. Let your leg relax completely after each repetition. Repeat __________ times. Complete this exercise __________ times a day. This information is not intended to replace advice given to you by your health care provider. Make sure you discuss any questions you have with your health care provider. Document Released: 10/02/2005 Document Revised: 09/08/2018 Document Reviewed: 09/08/2018 Elsevier Patient Education  2020 [employer]  

## 2019-06-28 NOTE — Progress Notes (Signed)
Name: Samuel Hendricks   MRN: [id number]    DOB: Aug 01, 1991   Date:06/28/2019       Progress Note  Subjective  Chief Complaint  Chief Complaint  Patient presents with  . Knee Pain    right twisted it while dancing    HPI  PT presents with right knee injury - had inversion injury while on gravel at a cook out this past weekend.  The knee inverted and twisted, but did not feel any clicks/pops.  He was able to bear some weight right after the injury, though he notes was limping.  He notes pain is ongoing but slightly improved, stiff in the morning still.  Applying ice, compression, elevation, and applying icy hot.  He does do a lot of walking at work - we will write him out for 2 additional days to allow rest.  Patient Active Problem List   Diagnosis Date Noted  . Mass of appendix   . Goiter diffuse 10/06/2018  . Lymphadenopathy of left cervical region 09/30/2018  . Bright red rectal bleeding 09/30/2018    Social History   Tobacco Use  . Smoking status: Never Smoker  . Smokeless tobacco: Never Used  Substance Use Topics  . Alcohol use: Yes    Frequency: Never    Comment: rarely     Current Outpatient Medications:  .  famotidine (PEPCID) 40 MG tablet, Take 1 tablet (40 mg total) by mouth daily., Disp: 30 tablet, Rfl: 2 .  loratadine (CLARITIN) 10 MG tablet, Take 10 mg by mouth daily., Disp: , Rfl:   No Known Allergies  I personally reviewed active problem list, medication list, allergies with the patient/caregiver today.  ROS  Ten systems reviewed and is negative except as mentioned in HPI   Objective  Vitals:   06/28/19 0953  BP: 138/82  Pulse: 88  Resp: 14  Temp: (!) 97.1 F (36.2 C)  SpO2: 98%  Weight: 284 lb 3.2 oz (128.9 kg)  Height: 6' (1.829 m)   Body mass index is 38.54 kg/m.  Nursing Note and Vital Signs reviewed.  Physical Exam  Constitutional: Patient appears well-developed and well-nourished. No distress.  HENT: Head: Normocephalic and  atraumatic. Neck: Normal range of motion. Neck supple.  Cardiovascular: Normal rate, regular rhythm and normal heart sounds.  No murmur heard. No BLE edema. Pulmonary/Chest: Effort normal and breath sounds normal. No respiratory distress. Musculoskeletal: Normal range of motion, no joint effusions. No gross deformities.  RIGHT knee does have mild soft tissue swelling.  There is no ecchymosis.  There is mild tenderness to the medial and lateral aspect of the knee.  Patella is intact, there is no crepitus on extension and flexion.  There is no laxity, negative drawer test.  Neurological: Pt is alert and oriented to person, place, and time. No cranial nerve deficit. Coordination, balance, strength, speech and gait are normal.  Skin: Skin is warm and dry. No rash noted. No erythema.  Psychiatric: Patient has a normal mood and affect. behavior is normal. Judgment and thought content normal.   No results found for this or any previous visit (from the past 72 hour(s)).  Assessment & Plan  1. Sprain of right knee, unspecified ligament, initial encounter - meloxicam (MOBIC) 15 MG tablet; Take 1 tablet (15 mg total) by mouth daily.  Dispense: 30 tablet; Refill: 0  -Red flags and when to present for emergency care or RTC including fever >101.67F, chest pain, shortness of breath, new/worsening/un-resolving symptoms reviewed with patient  at time of visit. Follow up and care instructions discussed and provided in AVS.

## 2019-07-07 ENCOUNTER — Ambulatory Visit: Payer: BLUE CROSS/BLUE SHIELD | Admitting: Family Medicine

## 2019-07-30 ENCOUNTER — Encounter: Payer: Self-pay | Admitting: Family Medicine

## 2019-07-30 ENCOUNTER — Other Ambulatory Visit: Payer: Self-pay

## 2019-07-30 ENCOUNTER — Ambulatory Visit: Payer: BC Managed Care – PPO | Admitting: Family Medicine

## 2019-07-30 VITALS — BP 130/68 | HR 94 | Temp 97.3°F | Resp 16 | Ht 73.0 in | Wt 286.4 lb

## 2019-07-30 DIAGNOSIS — K219 Gastro-esophageal reflux disease without esophagitis: Secondary | ICD-10-CM | POA: Diagnosis not present

## 2019-07-30 DIAGNOSIS — E669 Obesity, unspecified: Secondary | ICD-10-CM | POA: Diagnosis not present

## 2019-07-30 DIAGNOSIS — E049 Nontoxic goiter, unspecified: Secondary | ICD-10-CM

## 2019-07-30 DIAGNOSIS — R590 Localized enlarged lymph nodes: Secondary | ICD-10-CM | POA: Diagnosis not present

## 2019-07-30 DIAGNOSIS — M25561 Pain in right knee: Secondary | ICD-10-CM

## 2019-07-30 DIAGNOSIS — E040 Nontoxic diffuse goiter: Secondary | ICD-10-CM

## 2019-07-30 MED ORDER — FAMOTIDINE 40 MG PO TABS: 40.0000 mg | ORAL_TABLET | Freq: Every day | ORAL | 1 refills | Status: AC

## 2019-07-30 NOTE — Patient Instructions (Signed)
Keto diet for 2 weeks ( It means less than 20 g of carbs per day) After that increase to 30 g for a couple weeks and try to stay around 50 g after that but no more than 100 g

## 2019-07-30 NOTE — Progress Notes (Signed)
Name: Samuel Hendricks   MRN: [id number]    DOB: 05-Aug-1991   Date:07/30/2019       Progress Note  Subjective  Chief Complaint  Chief Complaint  Patient presents with  . Knee Pain    Sprained his right knee while dancing on gravel-has been icing his knee, wearing a knee brace and taking Meloxicam for relief. But it is still isn't 100% and his knee will buckle on him  . Obesity  . Follow-up    2 months-Elevated BP    HPI  Right knee pain: he had an injury while dancing one month ago and two weeks ago felt his patella moving medially and causes pain, after the first episode his right knee had some effusion but no redness, he was seen by Raelyn Ensign and was given ice, rest and was given Meloxicam and took it for about one week. He states went a for long walk a few days ago, he wore a brace but still felt some discomfort, also caused right calf and foot pain. He is afraid to injury with sudden movement   Cervical lymphadenopathy: had it evaluated by Dr. Celine Ahr and small lymphonodi slightly larger than normal, per patient not bothering him as much, no longer tender, not growing  Thyromegaly: mild , normal thyroid panel   Obesity: he was doing well with physical activity and diet, however since knee injury he has been less active, also not compliant with his diet, sitting more at home. Discussed carbohydrate restrictive diet.   Patient Active Problem List   Diagnosis Date Noted  . Mass of appendix   . Goiter diffuse 10/06/2018  . Lymphadenopathy of left cervical region 09/30/2018  . Bright red rectal bleeding 09/30/2018    Past Surgical History:  Procedure Laterality Date  . APPENDECTOMY  12/02/1997  . COLONOSCOPY WITH PROPOFOL N/A 11/02/2018   Procedure: COLONOSCOPY WITH PROPOFOL;  Surgeon: Jonathon Bellows, MD;  Location: [facility] ENDOSCOPY;  Service: Gastroenterology;  Laterality: N/A;  . COLONOSCOPY WITH PROPOFOL N/A 05/11/2019   Procedure: COLONOSCOPY WITH PROPOFOL;  Surgeon: Jonathon Bellows,  MD;  Location: [facility] ENDOSCOPY;  Service: Gastroenterology;  Laterality: N/A;    Family History  Problem Relation Age of Onset  . Anemia Mother   . Diabetes Father   . Thyroid disease Sister   . Hypertension Maternal Grandmother   . Heart disease Paternal Grandmother   . Diabetes Paternal Grandfather   . Colon cancer Neg Hx     Social History   Socioeconomic History  . Marital status: Single    Spouse name: Not on file  . Number of children: 0  . Years of education: Not on file  . Highest education level: Bachelor's degree (e.g., BA, AB, BS)  Occupational History  . Occupation: Admission Counselor    Comment: [employer]  . Financial resource strain: Not hard at all  . Food insecurity    Worry: Never true    Inability: Never true  . Transportation needs    Medical: No    Non-medical: No  Tobacco Use  . Smoking status: Never Smoker  . Smokeless tobacco: Never Used  Substance and Sexual Activity  . Alcohol use: Yes    Frequency: Never    Comment: rarely  . Drug use: Not Currently    Types: Marijuana    Comment: 7 months  . Sexual activity: Yes    Partners: Female    Birth control/protection: Condom  Lifestyle  . Physical activity  Days per week: 1 day    Minutes per session: 40 min  . Stress: To some extent  Relationships  . Social connections    Talks on phone: More than three times a week    Gets together: Three times a week    Attends religious service: More than 4 times per year    Active member of club or organization: Yes    Attends meetings of clubs or organizations: More than 4 times per year    Relationship status: Living with partner  . Intimate partner violence    Fear of current or ex partner: No    Emotionally abused: No    Physically abused: No    Forced sexual activity: No  Other Topics Concern  . Not on file  Social History Narrative   Graduated from [employer] in [employer]   Currently working at [employer]       Current Outpatient Medications:  .  famotidine (PEPCID) 40 MG tablet, Take 1 tablet (40 mg total) by mouth daily., Disp: 30 tablet, Rfl: 2 .  loratadine (CLARITIN) 10 MG tablet, Take 10 mg by mouth daily., Disp: , Rfl:  .  meloxicam (MOBIC) 15 MG tablet, Take 1 tablet (15 mg total) by mouth daily. (Patient taking differently: Take 15 mg by mouth 2 (two) times a week. ), Disp: 30 tablet, Rfl: 0  No Known Allergies  I personally reviewed active problem list, medication list, allergies, family history, social history with the patient/caregiver today.   ROS  Constitutional: Negative for fever , positive for  weight change.  Respiratory: Negative for cough and shortness of breath.   Cardiovascular: Negative for chest pain or palpitations.  Gastrointestinal: Negative for abdominal pain, no bowel changes.  Musculoskeletal: positive  for gait problem and intermittent right knee  joint swelling.  Skin: Negative for rash.  Neurological: Negative for dizziness or headache.  No other specific complaints in a complete review of systems (except as listed in HPI above).   Objective  Vitals:   07/30/19 1418  BP: 130/68  Pulse: 94  Resp: 16  Temp: (!) 97.3 F (36.3 C)  TempSrc: Temporal  SpO2: 99%  Weight: 286 lb 6.4 oz (129.9 kg)  Height: 6\' 1"  (1.854 m)    Body mass index is 37.79 kg/m.  Physical Exam  Constitutional: Patient appears well-developed and well-nourished. Obese  No distress.  HEENT: head atraumatic, normocephalic, pupils equal and reactive to light,  neck supple, throat within normal limits Cardiovascular: Normal rate, regular rhythm and normal heart sounds.  No murmur heard. No BLE edema. Pulmonary/Chest: Effort normal and breath sounds normal. No respiratory distress. Abdominal: Soft.  There is no tenderness. Psychiatric: Patient has a normal mood and affect. behavior is normal. Judgment and thought content normal.  Recent Results (from the past 2160 hour(s))   Novel Coronavirus, NAA (hospital order; send-out to ref lab)     Status: None   Collection Time: 05/07/19 12:03 PM   Specimen: Nasopharyngeal Swab; Respiratory  Result Value Ref Range   SARS-CoV-2, NAA NOT DETECTED NOT DETECTED    Comment: (NOTE) This test was developed and its performance characteristics determined by [employer]. This test has not been FDA cleared or approved. This test has been authorized by FDA under an Emergency Use Authorization (EUA). This test is only authorized for the duration of time the declaration that circumstances exist justifying the authorization of the emergency use of in vitro diagnostic tests for detection of SARS-CoV-2 virus and/or  diagnosis of COVID-19 infection under section 564(b)(1) of the Act, 21 U.S.C. KA:123727), unless the authorization is terminated or revoked sooner. When diagnostic testing is negative, the possibility of a false negative result should be considered in the context of a patient's recent exposures and the presence of clinical signs and symptoms consistent with COVID-19. An individual without symptoms of COVID-19 and who is not shedding SARS-CoV-2 virus would expect to have a negative (not detected) result in this assay. Performed  At: [facility] [address] [place], [place] [id number] Rush Farmer MD [id number]    Coronavirus Source NASOPHARYNGEAL     Comment: Performed at [facility], [address] [place], [place] [address]  Surgical pathology     Status: None   Collection Time: 05/11/19 10:14 AM  Result Value Ref Range   SURGICAL PATHOLOGY      Surgical Pathology CASE: [id number] PATIENT: Annabelle Harman Surgical Pathology Report     SPECIMEN SUBMITTED: A. Appendix polyp; cbx  CLINICAL HISTORY: None provided  PRE-OPERATIVE DIAGNOSIS: Appendix mass  POST-OPERATIVE DIAGNOSIS: Polyp     DIAGNOSIS: A. APPENDICEAL POLYP; COLD BIOPSY: - POLYPOID  FRAGMENTS OF BENIGN COLONIC MUCOSA WITH ACTIVE INFLAMMATION AND SUBMUCOSAL LYMPHOID AGGREGATE. - NEGATIVE FOR GLANDULAR ARCHITECTURAL DISTORTION OR OTHER FEATURES OF CHRONIC COLITIS. - NEGATIVE FOR VIRAL CYTOPATHIC EFFECT AND GRANULOMA. - NEGATIVE FOR ADENOMATOUS CHANGE AND MALIGNANCY.  Comment: Sections display colonic mucosa with acute inflammation involving the glandular epithelium through the full crypt depth. Crypt abscess are not identified.  The features are non-specific, but the differential diagnosis may include early inflammatory bowel disease, non-specific colitis, or changes secondary to procedure. Clinical and endoscopic correlation is re commended.  GROSS DESCRIPTION: A. Labeled: C BX polyp appendix Received: Formalin Tissue fragment(s): Multiple Size: Aggregate, 0.6 x 0.4 x 0.1 cm Description: Tan soft tissue fragments Entirely submitted in 1 cassette.   Final Diagnosis performed by Allena Napoleon, MD.   Electronically signed 05/12/2019 2:10:30PM The electronic signature indicates that the named Attending Pathologist has evaluated the specimen  Technical component performed at [facility], [address] [place], [place] [address] Lab: [phone] Dir: Rush Farmer, MD, MMM  Professional component performed at [facility], [facility], [place], [place], [place] [address] Lab: [phone] Dir: Dellia Nims. Rubinas, MD      PHQ2/9: Depression screen [facility] 2/9 07/30/2019 06/28/2019 05/03/2019 02/18/2019 11/11/2018  Decreased Interest 0 0 0 0 0  Down, Depressed, Hopeless 0 0 0 0 0  PHQ - 2 Score 0 0 0 0 0  Altered sleeping 0 0 0 0 1  Tired, decreased energy 0 0 0 2 0  Change in appetite 0 0 0 0 0  Feeling bad or failure about yourself  0 0 0 0 0  Trouble concentrating 0 0 0 0 0  Moving slowly or fidgety/restless 0 0 0 0 0  Suicidal thoughts 0 0 0 0 0  PHQ-9 Score 0 0 0 2 1  Difficult doing work/chores Not difficult at all Not difficult at all Not  difficult at all Not difficult at all Not difficult at all    phq 9 is negative   Fall Risk: Fall Risk  07/30/2019 06/28/2019 05/03/2019 02/18/2019 11/11/2018  Falls in the past year? 1 0 0 0 0  Number falls in past yr: 0 0 0 0 -  Injury with Fall? 1 0 0 0 -  Follow up - - - - -    Functional Status Survey: Is the patient deaf or have  difficulty hearing?: No Does the patient have difficulty seeing, even when wearing glasses/contacts?: Yes Does the patient have difficulty concentrating, remembering, or making decisions?: No Does the patient have difficulty walking or climbing stairs?: No Does the patient have difficulty dressing or bathing?: No Does the patient have difficulty doing errands alone such as visiting a doctor's office or shopping?: No    Assessment & Plan  1. Lymphadenopathy of left cervical region  Stable   2. Goiter diffuse  Stable   3. Obesity (BMI 35.0-39.9 without comorbidity)  Discussed with the patient the risk posed by an increased BMI. Discussed importance of portion control, calorie counting and at least 150 minutes of physical activity weekly. Avoid sweet beverages and drink more water. Eat at least 6 servings of fruit and vegetables daily   4. GERD without esophagitis  Resume a healthier diet  - famotidine (PEPCID) 40 MG tablet; Take 1 tablet (40 mg total) by mouth daily.  Dispense: 90 tablet; Refill: 1  5. Acute pain of right knee  Discussed talking to a PT student and doing some home strength training exercises

## 2019-11-15 ENCOUNTER — Encounter: Payer: Self-pay | Admitting: Family Medicine

## 2019-11-15 ENCOUNTER — Other Ambulatory Visit: Payer: Self-pay

## 2019-11-15 ENCOUNTER — Ambulatory Visit (INDEPENDENT_AMBULATORY_CARE_PROVIDER_SITE_OTHER): Payer: BC Managed Care – PPO | Admitting: Family Medicine

## 2019-11-15 VITALS — BP 134/80 | HR 108 | Temp 97.8°F | Resp 16 | Ht 74.25 in | Wt 290.5 lb

## 2019-11-15 DIAGNOSIS — Z0000 Encounter for general adult medical examination without abnormal findings: Secondary | ICD-10-CM

## 2019-11-15 DIAGNOSIS — Z13220 Encounter for screening for lipoid disorders: Secondary | ICD-10-CM

## 2019-11-15 DIAGNOSIS — Z131 Encounter for screening for diabetes mellitus: Secondary | ICD-10-CM | POA: Diagnosis not present

## 2019-11-15 DIAGNOSIS — Z23 Encounter for immunization: Secondary | ICD-10-CM

## 2019-11-15 NOTE — Patient Instructions (Signed)
Preventive Care 19-29 Years Old, Male Preventive care refers to lifestyle choices and visits with your health care provider that can promote health and wellness. This includes:  A yearly physical exam. This is also called an annual well check.  Regular dental and eye exams.  Immunizations.  Screening for certain conditions.  Healthy lifestyle choices, such as eating a healthy diet, getting regular exercise, not using drugs or products that contain nicotine and tobacco, and limiting alcohol use. What can I expect for my preventive care visit? Physical exam Your health care provider will check:  Height and weight. These may be used to calculate body mass index (BMI), which is a measurement that tells if you are at a healthy weight.  Heart rate and blood pressure.  Your skin for abnormal spots. Counseling Your health care provider may ask you questions about:  Alcohol, tobacco, and drug use.  Emotional well-being.  Home and relationship well-being.  Sexual activity.  Eating habits.  Work and work Statistician. What immunizations do I need?  Influenza (flu) vaccine  This is recommended every year. Tetanus, diphtheria, and pertussis (Tdap) vaccine  You may need a Td booster every 10 years. Varicella (chickenpox) vaccine  You may need this vaccine if you have not already been vaccinated. Human papillomavirus (HPV) vaccine  If recommended by your health care provider, you may need three doses over 6 months. Measles, mumps, and rubella (MMR) vaccine  You may need at least one dose of MMR. You may also need a second dose. Meningococcal conjugate (MenACWY) vaccine  One dose is recommended if you are 45-76 years old and a Market researcher living in a residence hall, or if you have one of several medical conditions. You may also need additional booster doses. Pneumococcal conjugate (PCV13) vaccine  You may need this if you have certain conditions and were not  previously vaccinated. Pneumococcal polysaccharide (PPSV23) vaccine  You may need one or two doses if you smoke cigarettes or if you have certain conditions. Hepatitis A vaccine  You may need this if you have certain conditions or if you travel or work in places where you may be exposed to hepatitis A. Hepatitis B vaccine  You may need this if you have certain conditions or if you travel or work in places where you may be exposed to hepatitis B. Haemophilus influenzae type b (Hib) vaccine  You may need this if you have certain risk factors. You may receive vaccines as individual doses or as more than one vaccine together in one shot (combination vaccines). Talk with your health care provider about the risks and benefits of combination vaccines. What tests do I need? Blood tests  Lipid and cholesterol levels. These may be checked every 5 years starting at age 17.  Hepatitis C test.  Hepatitis B test. Screening   Diabetes screening. This is done by checking your blood sugar (glucose) after you have not eaten for a while (fasting).  Sexually transmitted disease (STD) testing. Talk with your health care provider about your test results, treatment options, and if necessary, the need for more tests. Follow these instructions at home: Eating and drinking   Eat a diet that includes fresh fruits and vegetables, whole grains, lean protein, and low-fat dairy products.  Take vitamin and mineral supplements as recommended by your health care provider.  Do not drink alcohol if your health care provider tells you not to drink.  If you drink alcohol: ? Limit how much you have to 0-2  drinks a day. ? Be aware of how much alcohol is in your drink. In the [place] one drink equals one 12 oz bottle of beer (355 mL), one 5 oz glass of wine (148 mL), or one 1 oz glass of hard liquor (44 mL). Lifestyle  Take daily care of your teeth and gums.  Stay active. Exercise for at least 30 minutes on 5 or  more days each week.  Do not use any products that contain nicotine or tobacco, such as cigarettes, e-cigarettes, and chewing tobacco. If you need help quitting, ask your health care provider.  If you are sexually active, practice safe sex. Use a condom or other form of protection to prevent STIs (sexually transmitted infections). What's next?  Go to your health care provider once a year for a well check visit.  Ask your health care provider how often you should have your eyes and teeth checked.  Stay up to date on all vaccines. This information is not intended to replace advice given to you by your health care provider. Make sure you discuss any questions you have with your health care provider. Document Released: 01/14/2002 Document Revised: 11/12/2018 Document Reviewed: 11/12/2018 Elsevier Patient Education  2020 [employer]  

## 2019-11-15 NOTE — Progress Notes (Signed)
Name: Samuel Hendricks   MRN: [id number]    DOB: 25-Feb-1991   Date:11/15/2019       Progress Note  Subjective  Chief Complaint  Chief Complaint  Patient presents with  . Annual Exam    HPI  Patient presents for annual CPE  USPSTF grade A and B recommendations:  Diet: he is eating more vegetables, less fried food  Exercise: he was walking 5 times a week, but is down in frequency, resuming today   Depression: phq 9 is negative Depression screen [facility] 2/9 11/15/2019 07/30/2019 06/28/2019 05/03/2019 02/18/2019  Decreased Interest 0 0 0 0 0  Down, Depressed, Hopeless 0 0 0 0 0  PHQ - 2 Score 0 0 0 0 0  Altered sleeping 0 0 0 0 0  Tired, decreased energy 0 0 0 0 2  Change in appetite 0 0 0 0 0  Feeling bad or failure about yourself  0 0 0 0 0  Trouble concentrating 0 0 0 0 0  Moving slowly or fidgety/restless 0 0 0 0 0  Suicidal thoughts 0 0 0 0 0  PHQ-9 Score 0 0 0 0 2  Difficult doing work/chores - Not difficult at all Not difficult at all Not difficult at all Not difficult at all    Hypertension:  BP Readings from Last 3 Encounters:  11/15/19 134/80  07/30/19 130/68  06/28/19 138/82    Obesity: Wt Readings from Last 3 Encounters:  11/15/19 290 lb 8 oz (131.8 kg)  07/30/19 286 lb 6.4 oz (129.9 kg)  06/28/19 284 lb 3.2 oz (128.9 kg)   BMI Readings from Last 3 Encounters:  11/15/19 37.05 kg/m  07/30/19 37.79 kg/m  06/28/19 38.54 kg/m     Lipids:  Lab Results  Component Value Date   CHOL 140 10/02/2018   Lab Results  Component Value Date   HDL 35 (L) 10/02/2018   Lab Results  Component Value Date   LDLCALC 92 10/02/2018   Lab Results  Component Value Date   TRIG 50 10/02/2018   Lab Results  Component Value Date   CHOLHDL 4.0 10/02/2018   No results found for: LDLDIRECT Glucose:  Glucose, Bld  Date Value Ref Range Status  10/02/2018 94 65 - 99 mg/dL Final    Comment:    .            Fasting reference interval .   09/07/2018 99 70 - 99 mg/dL  Final      [facility] Visit from 11/15/2019 in [facility]  AUDIT-C Score  1      Single STD testing and prevention (HIV/chl/gon/syphilis): not interested  Hep C: today   Skin cancer: Discussed monitoring for atypical lesions Colorectal cancer: had colonoscopy 2019 for rectal bleeding, has hemorrhoids Prostate cancer: USPTF  IPSS Questionnaire (AUA-7): Over the past month.   1)  How often have you had a sensation of not emptying your bladder completely after you finish urinating?  0 - Not at all  2)  How often have you had to urinate again less than two hours after you finished urinating? 3 - About half the time  3)  How often have you found you stopped and started again several times when you urinated?  0 - Not at all  4) How difficult have you found it to postpone urination?  0 - Not at all  5) How often have you had a weak urinary stream?  0 - Not at all  6)  How often have you had to push or strain to begin urination?  0 - Not at all  7) How many times did you most typically get up to urinate from the time you went to bed until the time you got up in the morning?  1 - 1 time  Total score:  0-7 mildly symptomatic   8-19 moderately symptomatic   20-35 severely symptomatic       Advanced Care Planning: A voluntary discussion about advance care planning including the explanation and discussion of advance directives.  Discussed health care proxy and Living will, and the patient was able to identify a health care proxy as mother   Patient does not have a living will at present time  Patient Active Problem List   Diagnosis Date Noted  . Obesity (BMI 35.0-39.9 without comorbidity) 07/30/2019  . GERD without esophagitis 07/30/2019  . Goiter diffuse 10/06/2018  . Lymphadenopathy of left cervical region 09/30/2018    Past Surgical History:  Procedure Laterality Date  . APPENDECTOMY  12/02/1997  . COLONOSCOPY WITH PROPOFOL N/A 11/02/2018   Procedure: COLONOSCOPY  WITH PROPOFOL;  Surgeon: Jonathon Bellows, MD;  Location: [facility] ENDOSCOPY;  Service: Gastroenterology;  Laterality: N/A;  . COLONOSCOPY WITH PROPOFOL N/A 05/11/2019   Procedure: COLONOSCOPY WITH PROPOFOL;  Surgeon: Jonathon Bellows, MD;  Location: [facility] ENDOSCOPY;  Service: Gastroenterology;  Laterality: N/A;    Family History  Problem Relation Age of Onset  . Anemia Mother   . Diabetes Father   . Thyroid disease Sister   . Hypertension Maternal Grandmother   . Heart disease Paternal Grandmother   . Diabetes Paternal Grandfather   . Colon cancer Neg Hx     Social History   Socioeconomic History  . Marital status: Single    Spouse name: Not on file  . Number of children: 0  . Years of education: Not on file  . Highest education level: Bachelor's degree (e.g., BA, AB, BS)  Occupational History  . Occupation: Admission Counselor    Comment: [employer]   Tobacco Use  . Smoking status: Never Smoker  . Smokeless tobacco: Never Used  Substance and Sexual Activity  . Alcohol use: Yes    Comment: rarely  . Drug use: Not Currently    Types: Marijuana    Comment: 7 months  . Sexual activity: Yes    Partners: Female    Birth control/protection: Condom  Other Topics Concern  . Not on file  Social History Narrative   Graduated from [employer] in [employer]   Currently working at [employer] is pregnant, getting married in Feb, baby is due May 2021   Social Determinants of Health   Financial Resource Strain: [place]   . Difficulty of Paying Living Expenses: Not hard at all  Food Insecurity: No Food Insecurity  . Worried About Charity fundraiser in the Last Year: Never true  . Ran Out of Food in the Last Year: Never true  Transportation Needs: No Transportation Needs  . Lack of Transportation (Medical): No  . Lack of Transportation (Non-Medical): No  Physical Activity: Insufficiently Active  . Days of Exercise per Week: 3 days  . Minutes of Exercise per Session:  40 min  Stress: No Stress Concern Present  . Feeling of Stress : Only a little  Social Connections: Not Isolated  . Frequency of Communication with Friends and Family: More than three times a week  . Frequency of Social Gatherings with Friends  and Family: More than three times a week  . Attends Religious Services: More than 4 times per year  . Active Member of Clubs or Organizations: Yes  . Attends Archivist Meetings: More than 4 times per year  . Marital Status: Living with partner  Intimate Partner Violence: Not At Risk  . Fear of Current or Ex-Partner: No  . Emotionally Abused: No  . Physically Abused: No  . Sexually Abused: No     Current Outpatient Medications:  .  famotidine (PEPCID) 40 MG tablet, Take 1 tablet (40 mg total) by mouth daily., Disp: 90 tablet, Rfl: 1 .  loratadine (CLARITIN) 10 MG tablet, Take 10 mg by mouth daily., Disp: , Rfl:  .  meloxicam (MOBIC) 15 MG tablet, Take 1 tablet (15 mg total) by mouth daily. (Patient not taking: Reported on 11/15/2019), Disp: 30 tablet, Rfl: 0  No Known Allergies   ROS  Constitutional: Negative for fever or weight change.  Respiratory: Negative for cough and shortness of breath.   Cardiovascular: Negative for chest pain or palpitations.  Gastrointestinal: Negative for abdominal pain, no bowel changes.  Musculoskeletal: Negative for gait problem or joint swelling.  Skin: Negative for rash.  Neurological: Negative for dizziness or headache.  No other specific complaints in a complete review of systems (except as listed in HPI above).  Objective  Vitals:   11/15/19 0819  BP: 134/80  Pulse: (!) 108  Resp: 16  Temp: 97.8 F (36.6 C)  TempSrc: Temporal  SpO2: 97%  Weight: 290 lb 8 oz (131.8 kg)  Height: 6' 2.25" (1.886 m)    Body mass index is 37.05 kg/m.  Physical Exam  Constitutional: Patient appears well-developed and well-nourished. No distress.  HENT: Head: Normocephalic and atraumatic. Ears: B  TMs ok, no erythema or effusion; nose and oral mucosa not examined  Eyes: Conjunctivae and EOM are normal. Pupils are equal, round, and reactive to light. No scleral icterus.  Neck: Normal range of motion. Neck supple. No JVD present. No thyromegaly present.  Cardiovascular: Normal rate, regular rhythm and normal heart sounds.  No murmur heard. No BLE edema. Pulmonary/Chest: Effort normal and breath sounds normal. No respiratory distress. Abdominal: Soft. Bowel sounds are normal, no distension. There is no tenderness. no masses Samuel GENITALIA: Normal descended testes bilaterally, no masses palpated, no hernias, no lesions, no discharge RECTAL: not done  Musculoskeletal: Normal range of motion, no joint effusions. No gross deformities Neurological: he is alert and oriented to person, place, and time. No cranial nerve deficit. Coordination, balance, strength, speech and gait are normal.  Skin: Skin is warm and dry. No rash noted. No erythema.  Psychiatric: Patient has a normal mood and affect. behavior is normal. Judgment and thought content normal.   PHQ2/9: Depression screen [facility] 2/9 11/15/2019 07/30/2019 06/28/2019 05/03/2019 02/18/2019  Decreased Interest 0 0 0 0 0  Down, Depressed, Hopeless 0 0 0 0 0  PHQ - 2 Score 0 0 0 0 0  Altered sleeping 0 0 0 0 0  Tired, decreased energy 0 0 0 0 2  Change in appetite 0 0 0 0 0  Feeling bad or failure about yourself  0 0 0 0 0  Trouble concentrating 0 0 0 0 0  Moving slowly or fidgety/restless 0 0 0 0 0  Suicidal thoughts 0 0 0 0 0  PHQ-9 Score 0 0 0 0 2  Difficult doing work/chores - Not difficult at all Not difficult at all Not difficult at  all Not difficult at all     Fall Risk: Fall Risk  11/15/2019 07/30/2019 06/28/2019 05/03/2019 02/18/2019  Falls in the past year? 0 1 0 0 0  Number falls in past yr: 0 0 0 0 0  Injury with Fall? 0 1 0 0 0  Follow up - - - - -     Functional Status Survey: Is the patient deaf or have difficulty hearing?:  No Does the patient have difficulty seeing, even when wearing glasses/contacts?: No Does the patient have difficulty concentrating, remembering, or making decisions?: No Does the patient have difficulty walking or climbing stairs?: No Does the patient have difficulty dressing or bathing?: No Does the patient have difficulty doing errands alone such as visiting a doctor's office or shopping?: No    Assessment & Plan  1. Well adult exam  - Lipid panel - Hemoglobin A1c - COMPLETE METABOLIC PANEL WITH GFR - CBC with Differential/Platelet - Hepatitis C antibody  2. Need for immunization against influenza  - Flu Vaccine QUAD 36+ mos IM  3. Lipid screening  - Lipid panel  4. Diabetes mellitus screening  - Hemoglobin A1c   -Prostate cancer screening and PSA options (with potential risks and benefits of testing vs not testing) were discussed along with recent recs/guidelines. -USPSTF grade A and B recommendations reviewed with patient; age-appropriate recommendations, preventive care, screening tests, etc discussed and encouraged; healthy living encouraged; see AVS for patient education given to patient -Discussed importance of 150 minutes of physical activity weekly, eat two servings of fish weekly, eat one serving of tree nuts ( cashews, pistachios, pecans, almonds.Marland Kitchen) every other day, eat 6 servings of fruit/vegetables daily and drink plenty of water and avoid sweet beverages.

## 2019-11-16 ENCOUNTER — Encounter: Payer: Self-pay | Admitting: Family Medicine

## 2019-11-16 LAB — CBC WITH DIFFERENTIAL/PLATELET
Absolute Monocytes: 421 cells/uL (ref 200–950)
Basophils Absolute: 22 cells/uL (ref 0–200)
Basophils Relative: 0.5 %
Eosinophils Absolute: 202 cells/uL (ref 15–500)
Eosinophils Relative: 4.7 %
HCT: 45 % (ref 38.5–50.0)
Hemoglobin: 14.8 g/dL (ref 13.2–17.1)
Lymphs Abs: 1415 cells/uL (ref 850–3900)
MCH: 25.9 pg — ABNORMAL LOW (ref 27.0–33.0)
MCHC: 32.9 g/dL (ref 32.0–36.0)
MCV: 78.8 fL — ABNORMAL LOW (ref 80.0–100.0)
MPV: 10.1 fL (ref 7.5–12.5)
Monocytes Relative: 9.8 %
Neutro Abs: 2240 cells/uL (ref 1500–7800)
Neutrophils Relative %: 52.1 %
Platelets: 322 10*3/uL (ref 140–400)
RBC: 5.71 10*6/uL (ref 4.20–5.80)
RDW: 13.2 % (ref 11.0–15.0)
Total Lymphocyte: 32.9 %
WBC: 4.3 10*3/uL (ref 3.8–10.8)

## 2019-11-16 LAB — COMPLETE METABOLIC PANEL WITH GFR
AG Ratio: 1.3 (calc) (ref 1.0–2.5)
ALT: 23 U/L (ref 9–46)
AST: 16 U/L (ref 10–40)
Albumin: 4.1 g/dL (ref 3.6–5.1)
Alkaline phosphatase (APISO): 54 U/L (ref 36–130)
BUN: 13 mg/dL (ref 7–25)
CO2: 24 mmol/L (ref 20–32)
Calcium: 9.5 mg/dL (ref 8.6–10.3)
Chloride: 104 mmol/L (ref 98–110)
Creat: 1.17 mg/dL (ref 0.60–1.35)
GFR, Est African American: 97 mL/min/{1.73_m2} (ref >=60)
GFR, Est Non African American: 84 mL/min/{1.73_m2} (ref >=60)
Globulin: 3.1 g/dL (calc) (ref 1.9–3.7)
Glucose, Bld: 101 mg/dL — ABNORMAL HIGH (ref 65–99)
Potassium: 4 mmol/L (ref 3.5–5.3)
Sodium: 137 mmol/L (ref 135–146)
Total Bilirubin: 0.5 mg/dL (ref 0.2–1.2)
Total Protein: 7.2 g/dL (ref 6.1–8.1)

## 2019-11-16 LAB — HEPATITIS C ANTIBODY
Hepatitis C Ab: NONREACTIVE
SIGNAL TO CUT-OFF: 0.02 (ref <1.00)

## 2019-11-16 LAB — HEMOGLOBIN A1C
Hgb A1c MFr Bld: 5.1 % of total Hgb (ref <5.7)
Mean Plasma Glucose: 100 (calc)
eAG (mmol/L): 5.5 (calc)

## 2019-11-16 LAB — LIPID PANEL
Cholesterol: 147 mg/dL (ref <200)
HDL: 41 mg/dL (ref >=40)
LDL Cholesterol (Calc): 92 mg/dL (calc)
Non-HDL Cholesterol (Calc): 106 mg/dL (calc) (ref <130)
Total CHOL/HDL Ratio: 3.6 (calc) (ref <5.0)
Triglycerides: 59 mg/dL (ref <150)

## 2020-02-26 DIAGNOSIS — Z20828 Contact with and (suspected) exposure to other viral communicable diseases: Secondary | ICD-10-CM | POA: Diagnosis not present

## 2020-02-26 DIAGNOSIS — Z03818 Encounter for observation for suspected exposure to other biological agents ruled out: Secondary | ICD-10-CM | POA: Diagnosis not present

## 2020-12-11 IMAGING — CT CT ABD-PELV W/ CM
2 of 4 series · 15 of 46 positions shown, 17 images · IV contrast (iopamidol)
Comparison: None.

CLINICAL DATA: Abnormal appendiceal orifice on recent colonoscopy.

EXAM:
CT ABDOMEN AND PELVIS WITH CONTRAST
TECHNIQUE: Multidetector CT imaging of the abdomen and pelvis was performed
using the standard protocol following bolus administration of
intravenous contrast.
CONTRAST:  100mL [id number] IOPAMIDOL ([id number]) INJECTION 61%

[Series 2: abd pelvis · axial · 0.75mm/px · z∈[-1660,-1160]mm · 12 of 110 slices shown, 14 images (1 of 2)]
[im 5/110  soft-tissue]
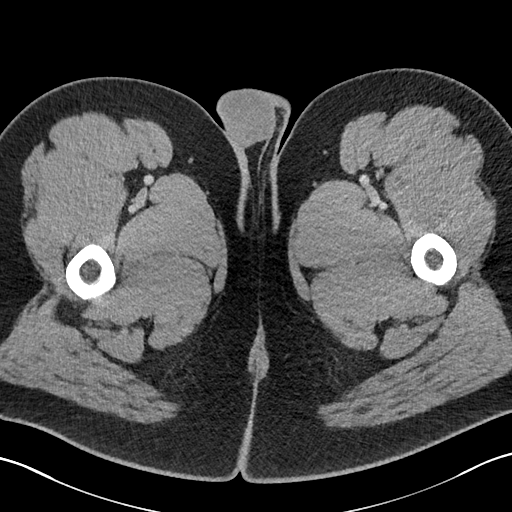
[im 5/110  bone]
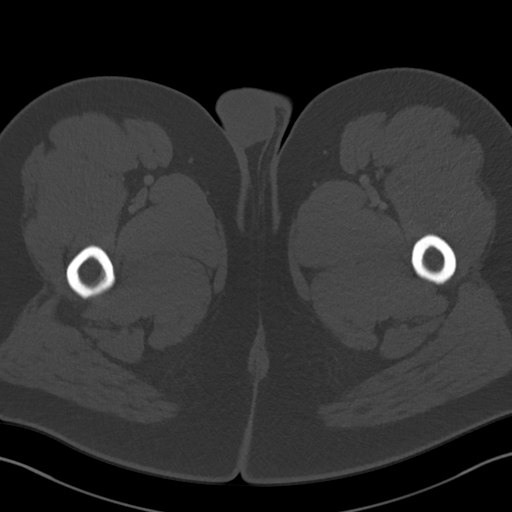
[im 15/110  soft-tissue]
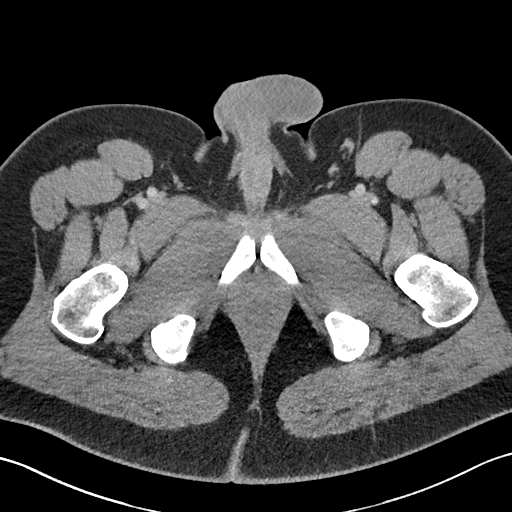
[im 24/110  soft-tissue]
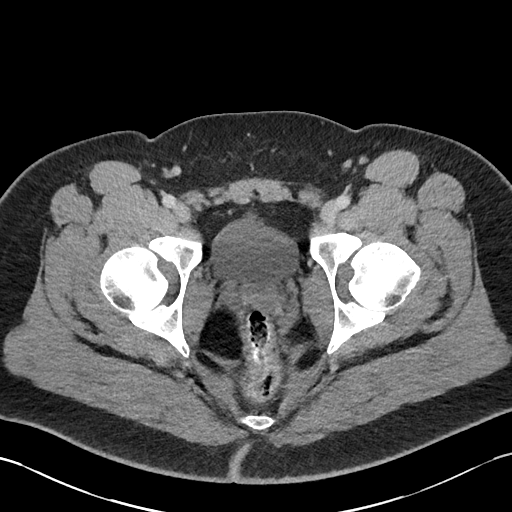
[im 34/110  soft-tissue]
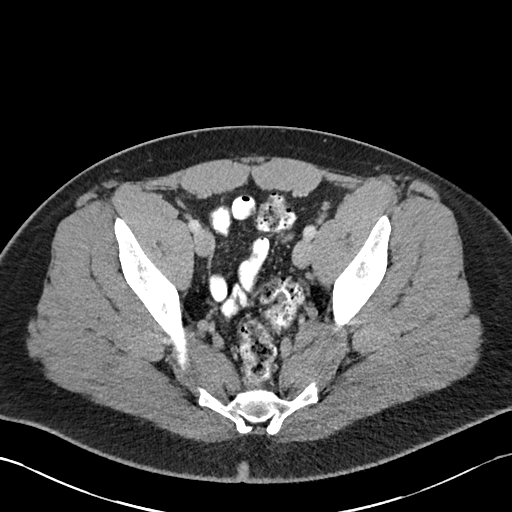
[im 43/110  soft-tissue]
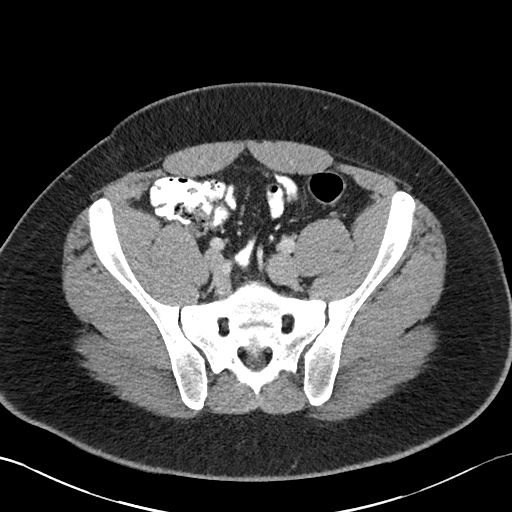
[im 53/110  soft-tissue]
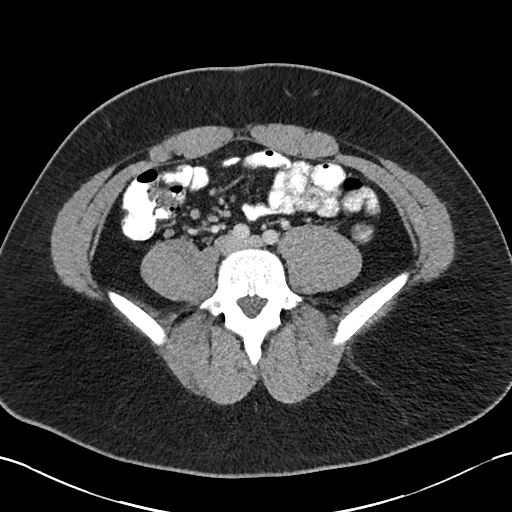
[im 57/110  soft-tissue]
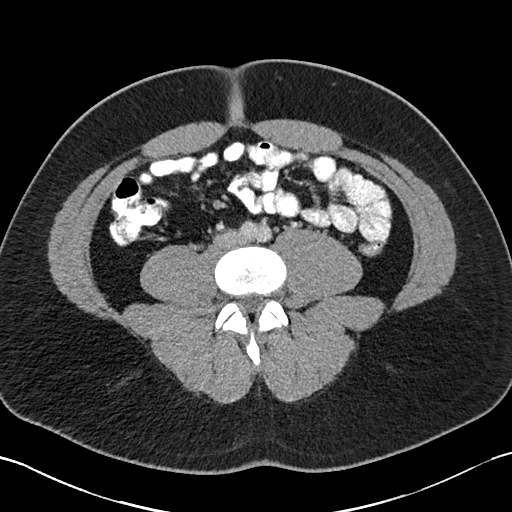
[im 67/110  soft-tissue]
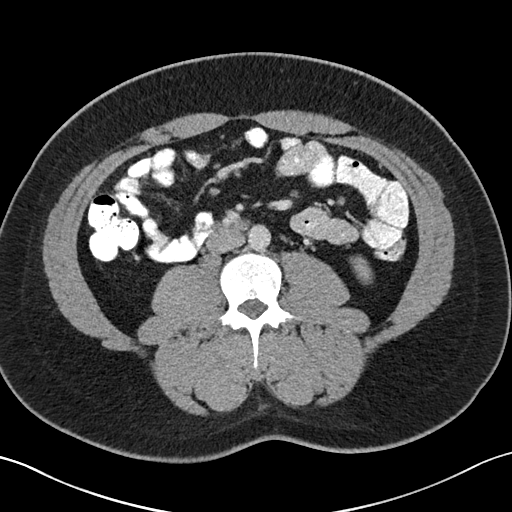
[im 76/110  soft-tissue]
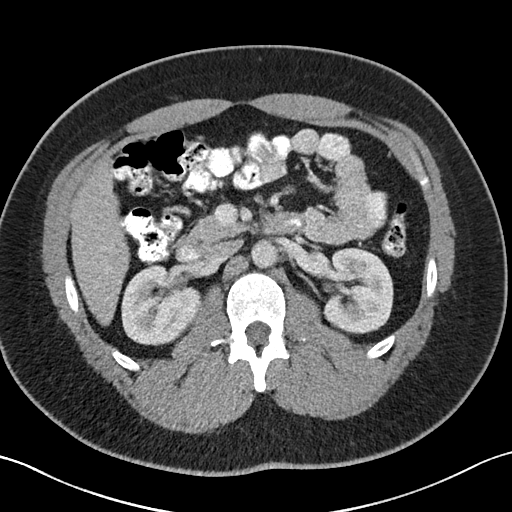
[im 76/110  bone]
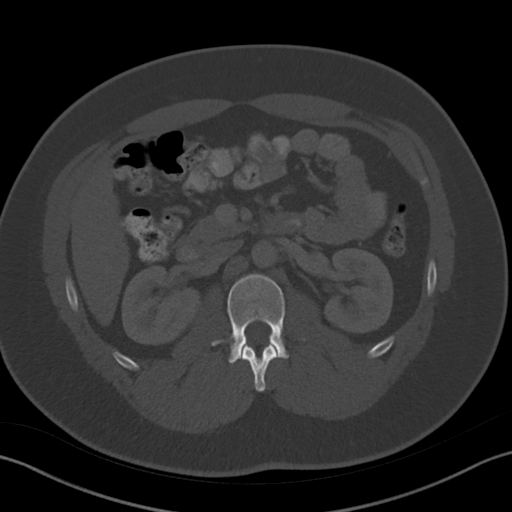
[im 86/110  soft-tissue]
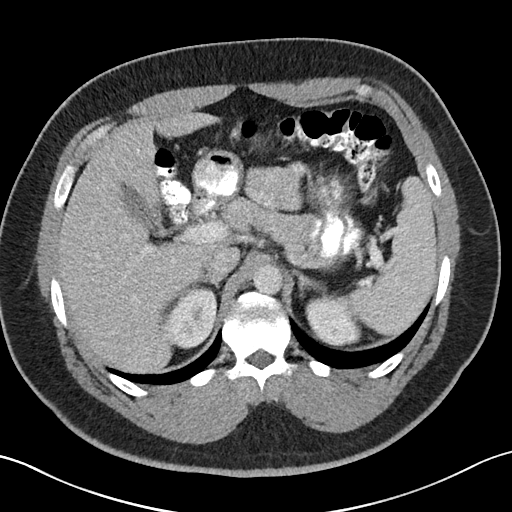
[im 95/110  soft-tissue]
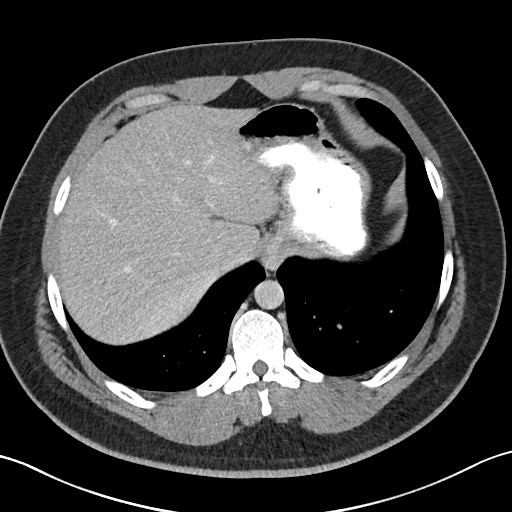
[im 105/110  soft-tissue]
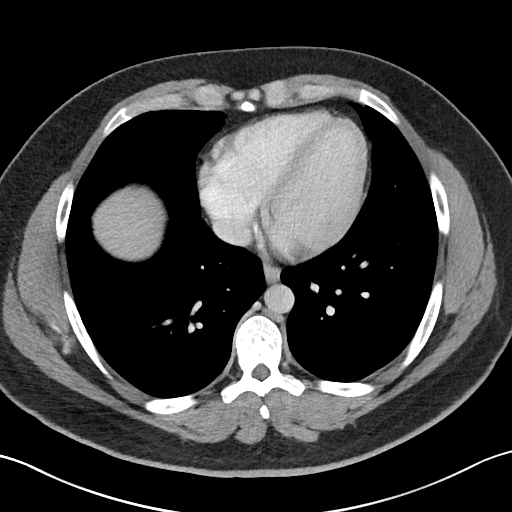

[Series 4: abd pelvis · coronal · 0.75mm/px · 3 of 167 slices shown (2 of 2)]
[im 56/167  soft-tissue]
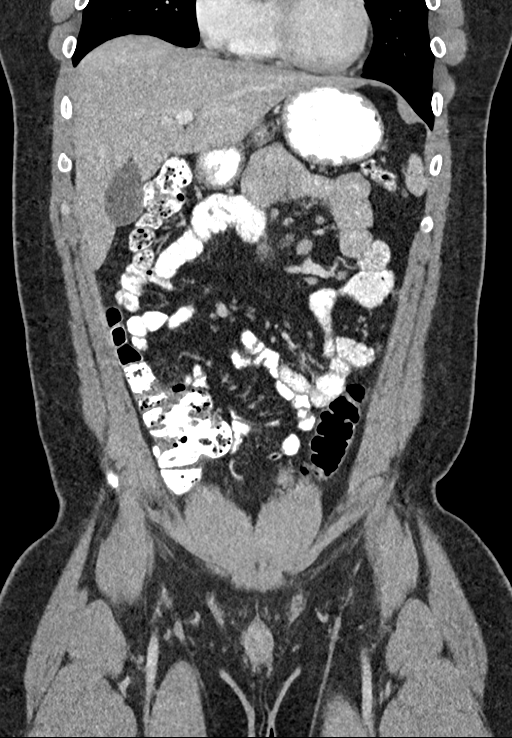
[im 74/167  soft-tissue]
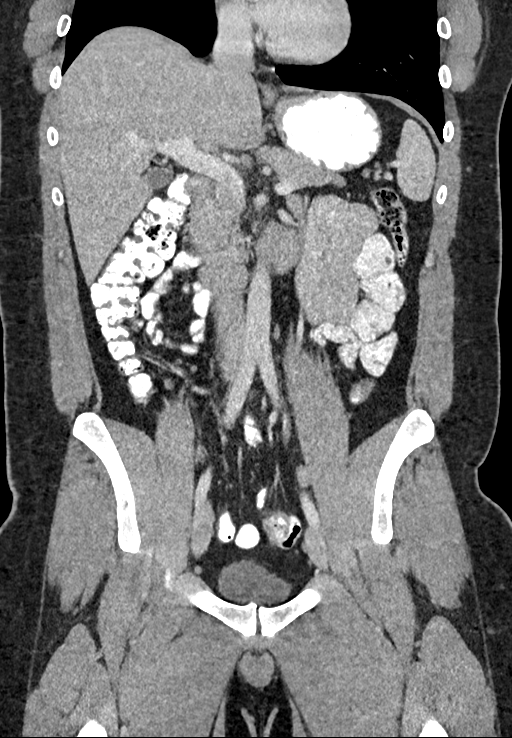
[im 93/167  soft-tissue]
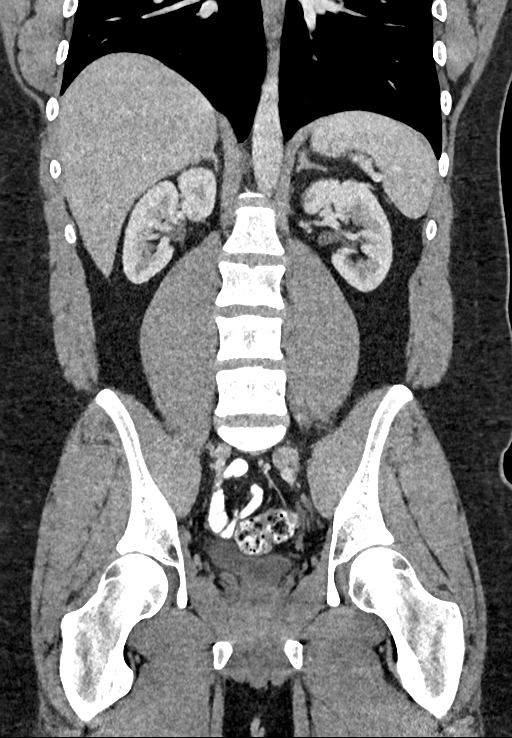

[15 of 46 positions shown; findings below may reference images not displayed]

FINDINGS: Lower chest: The lung bases are clear of acute process. No pleural
effusion or pulmonary lesions. The heart is normal in size. No
pericardial effusion. The distal esophagus and aorta are
unremarkable.

Hepatobiliary: No focal hepatic lesions or intrahepatic biliary
dilatation. The gallbladder is normal. No common bile duct
dilatation.

Pancreas: No mass, inflammation or ductal dilatation.

Spleen: Normal size.  No focal lesions.

Adrenals/Urinary Tract: The adrenal glands and kidneys are normal.
The bladder is normal.

Stomach/Bowel: The stomach, duodenum, small bowel and colon are
unremarkable. No acute inflammatory process, mass lesions or
obstructive findings. The terminal ileum is normal. I do not see an
obvious abnormality involving the cecum. No masses identified.

Vascular/Lymphatic: The aorta is normal in caliber. No dissection.
The branch vessels are patent. The major venous structures are
patent. No mesenteric or retroperitoneal mass or adenopathy. Small
scattered lymph nodes are noted.

Reproductive: The prostate gland and seminal vesicles are
unremarkable.

Other: No pelvic mass or adenopathy. No free pelvic fluid
collections. No inguinal mass or adenopathy. No abdominal wall
hernia or subcutaneous lesions.

Musculoskeletal: No significant bony findings.
IMPRESSION: 1. No acute abdominal/pelvic findings, mass lesions or adenopathy.
2. Status post appendectomy. I do not see an obvious cecal lesion or
pericecal lesion.

## 2021-08-21 ENCOUNTER — Encounter: Payer: Self-pay | Admitting: General Surgery
